# Patient Record
Sex: Male | Born: 1980 | Race: White | Hispanic: No | Marital: Married
Health system: Southern US, Community
[De-identification: ages and names within clinical notes are randomized; demographics above are authoritative.]

---

## 2004-06-05 ENCOUNTER — Emergency Department (HOSPITAL_COMMUNITY): Admission: EM | Admit: 2004-06-05 | Discharge: 2004-06-05 | Payer: Self-pay | Admitting: Emergency Medicine

## 2017-07-29 DIAGNOSIS — I10 Essential (primary) hypertension: Secondary | ICD-10-CM | POA: Diagnosis not present

## 2017-07-29 DIAGNOSIS — R51 Headache: Secondary | ICD-10-CM | POA: Diagnosis not present

## 2017-08-19 DIAGNOSIS — I10 Essential (primary) hypertension: Secondary | ICD-10-CM | POA: Diagnosis not present

## 2017-08-19 DIAGNOSIS — Z23 Encounter for immunization: Secondary | ICD-10-CM | POA: Diagnosis not present

## 2017-09-24 DIAGNOSIS — I10 Essential (primary) hypertension: Secondary | ICD-10-CM | POA: Diagnosis not present

## 2018-07-05 DIAGNOSIS — I10 Essential (primary) hypertension: Secondary | ICD-10-CM | POA: Diagnosis not present

## 2018-07-05 DIAGNOSIS — Z23 Encounter for immunization: Secondary | ICD-10-CM | POA: Diagnosis not present

## 2018-07-05 DIAGNOSIS — E782 Mixed hyperlipidemia: Secondary | ICD-10-CM | POA: Diagnosis not present

## 2018-08-18 DIAGNOSIS — R300 Dysuria: Secondary | ICD-10-CM | POA: Diagnosis not present

## 2018-09-10 DIAGNOSIS — E782 Mixed hyperlipidemia: Secondary | ICD-10-CM | POA: Diagnosis not present

## 2018-09-10 DIAGNOSIS — I10 Essential (primary) hypertension: Secondary | ICD-10-CM | POA: Diagnosis not present

## 2018-09-10 DIAGNOSIS — N183 Chronic kidney disease, stage 3 (moderate): Secondary | ICD-10-CM | POA: Diagnosis not present

## 2018-09-10 DIAGNOSIS — R739 Hyperglycemia, unspecified: Secondary | ICD-10-CM | POA: Diagnosis not present

## 2018-09-13 ENCOUNTER — Other Ambulatory Visit: Payer: Self-pay | Admitting: Family Medicine

## 2018-09-13 DIAGNOSIS — N183 Chronic kidney disease, stage 3 unspecified: Secondary | ICD-10-CM

## 2018-09-20 ENCOUNTER — Other Ambulatory Visit: Payer: Self-pay

## 2018-09-24 ENCOUNTER — Ambulatory Visit
Admission: RE | Admit: 2018-09-24 | Discharge: 2018-09-24 | Disposition: A | Discharge status: Home / Self Care | Payer: 59 | Source: Ambulatory Visit | Attending: Family Medicine | Admitting: Family Medicine

## 2018-09-24 DIAGNOSIS — N183 Chronic kidney disease, stage 3 unspecified: Secondary | ICD-10-CM

## 2018-09-24 DIAGNOSIS — N189 Chronic kidney disease, unspecified: Secondary | ICD-10-CM | POA: Diagnosis not present

## 2018-12-17 DIAGNOSIS — N182 Chronic kidney disease, stage 2 (mild): Secondary | ICD-10-CM | POA: Diagnosis not present

## 2018-12-17 DIAGNOSIS — N281 Cyst of kidney, acquired: Secondary | ICD-10-CM | POA: Diagnosis not present

## 2018-12-17 DIAGNOSIS — I129 Hypertensive chronic kidney disease with stage 1 through stage 4 chronic kidney disease, or unspecified chronic kidney disease: Secondary | ICD-10-CM | POA: Diagnosis not present

## 2018-12-28 ENCOUNTER — Other Ambulatory Visit: Payer: Self-pay | Admitting: Internal Medicine

## 2018-12-28 DIAGNOSIS — R5381 Other malaise: Secondary | ICD-10-CM

## 2018-12-28 DIAGNOSIS — I129 Hypertensive chronic kidney disease with stage 1 through stage 4 chronic kidney disease, or unspecified chronic kidney disease: Secondary | ICD-10-CM

## 2018-12-28 DIAGNOSIS — N281 Cyst of kidney, acquired: Secondary | ICD-10-CM

## 2018-12-28 DIAGNOSIS — N182 Chronic kidney disease, stage 2 (mild): Secondary | ICD-10-CM

## 2018-12-28 DIAGNOSIS — Q613 Polycystic kidney, unspecified: Secondary | ICD-10-CM

## 2018-12-31 ENCOUNTER — Other Ambulatory Visit: Payer: Self-pay | Admitting: Internal Medicine

## 2019-01-03 ENCOUNTER — Other Ambulatory Visit: Payer: Self-pay | Admitting: Internal Medicine

## 2019-01-03 DIAGNOSIS — Q613 Polycystic kidney, unspecified: Secondary | ICD-10-CM

## 2019-01-10 ENCOUNTER — Other Ambulatory Visit: Payer: Self-pay

## 2019-01-10 ENCOUNTER — Ambulatory Visit
Admission: RE | Admit: 2019-01-10 | Discharge: 2019-01-10 | Disposition: A | Discharge status: Home / Self Care | Payer: 59 | Source: Ambulatory Visit | Attending: Internal Medicine | Admitting: Internal Medicine

## 2019-01-10 DIAGNOSIS — N182 Chronic kidney disease, stage 2 (mild): Secondary | ICD-10-CM

## 2019-01-10 DIAGNOSIS — Q613 Polycystic kidney, unspecified: Secondary | ICD-10-CM | POA: Diagnosis not present

## 2019-01-10 DIAGNOSIS — I129 Hypertensive chronic kidney disease with stage 1 through stage 4 chronic kidney disease, or unspecified chronic kidney disease: Secondary | ICD-10-CM

## 2019-01-10 DIAGNOSIS — N281 Cyst of kidney, acquired: Secondary | ICD-10-CM

## 2019-01-10 MED ORDER — GADOBENATE DIMEGLUMINE 529 MG/ML IV SOLN
19.0000 mL | Freq: Once | INTRAVENOUS | Status: AC | PRN
  Administered 2019-01-10: 19 mL via INTRAVENOUS

## 2020-05-08 ENCOUNTER — Other Ambulatory Visit: Payer: Self-pay | Admitting: Internal Medicine

## 2020-05-08 DIAGNOSIS — N281 Cyst of kidney, acquired: Secondary | ICD-10-CM

## 2020-05-15 ENCOUNTER — Ambulatory Visit
Admission: RE | Admit: 2020-05-15 | Discharge: 2020-05-15 | Disposition: A | Discharge status: Home / Self Care | Payer: 59 | Source: Ambulatory Visit | Attending: Internal Medicine | Admitting: Internal Medicine

## 2020-05-15 DIAGNOSIS — N281 Cyst of kidney, acquired: Secondary | ICD-10-CM

## 2020-05-26 IMAGING — MR MRI HEAD WITHOUT AND WITH CONTRAST
12 series · 48 of 48 positions shown · IV contrast (multihance)
Comparison: MRI of the abdomen reported separately.

CLINICAL DATA: Evaluate for intracranial saccular aneurysm. History
of polycystic kidney disease.

EXAM:
MRI HEAD WITHOUT AND WITH CONTRAST
TECHNIQUE: Multiplanar, multiecho pulse sequences of the brain and surrounding
structures were obtained without and with intravenous contrast.
CONTRAST:  19mL MULTIHANCE GADOBENATE DIMEGLUMINE 529 MG/ML IV SOLN

[Series 8: T2 · axial · 4.0mm · 0.36mm/px · z∈[-58,+82]mm · 2 of 28 slices shown]
[im 1/28]
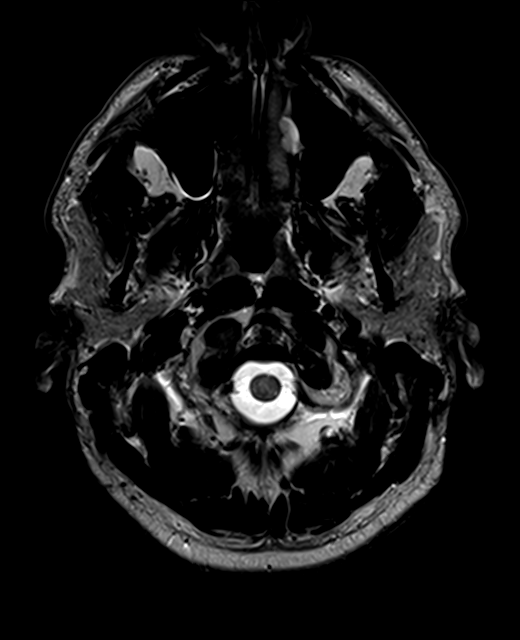
[im 28/28]
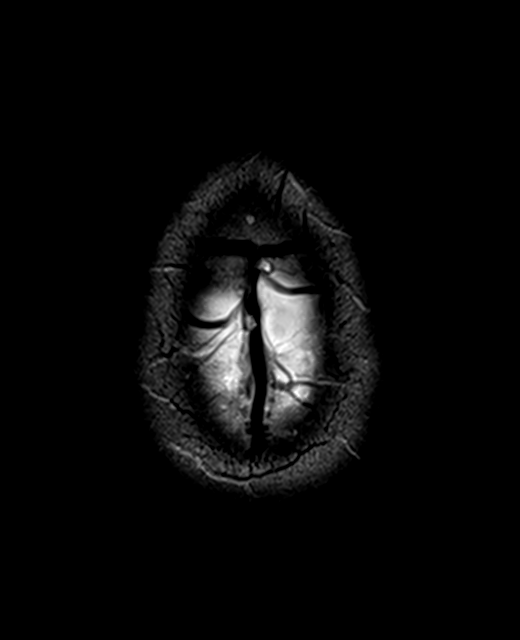

[Series 9: DWI · axial · 3.0mm · 1.44mm/px · z∈[-54,+80]mm · 6 of 84 slices shown (1 of 4)]
[im 1/84]
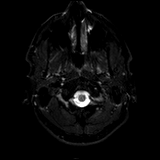
[im 17/84]
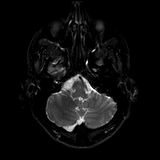
[im 34/84]
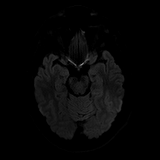
[im 50/84]
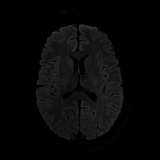
[im 67/84]
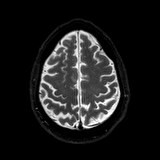
[im 84/84]
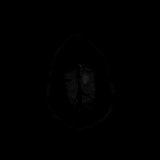

[Series 10: DWI · axial · 3.0mm · 1.44mm/px · z∈[-54,+80]mm · 3 of 40 slices shown (2 of 4)]
[im 1/40]
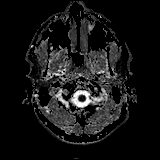
[im 20/40]
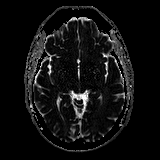
[im 40/40]
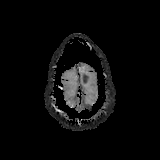

[Series 11: DWI · coronal · 5.0mm · 1.44mm/px · 4 of 64 slices shown (3 of 4)]
[im 1/64]
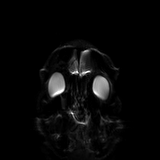
[im 22/64]
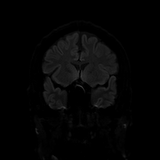
[im 43/64]
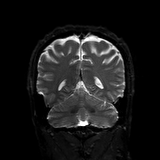
[im 64/64]
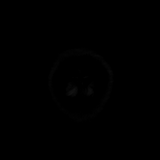

[Series 12: DWI · coronal · 5.0mm · 1.44mm/px · 2 of 32 slices shown (4 of 4)]
[im 1/32]
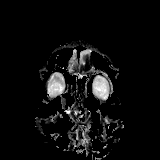
[im 32/32]
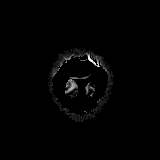

[Series 14: swi_images · axial · 4.0mm · 0.90mm/px · z∈[-58,+81]mm · 2 of 36 slices shown]
[im 1/36]
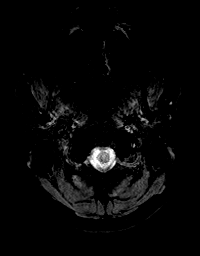
[im 36/36]
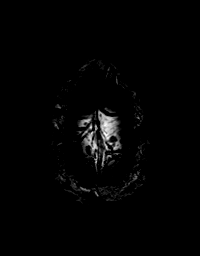

[Series 15: FLAIR · axial · 3.0mm · 0.72mm/px · z∈[-64,+91]mm · 3 of 40 slices shown]
[im 1/40]
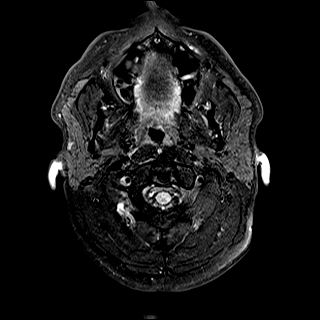
[im 20/40]
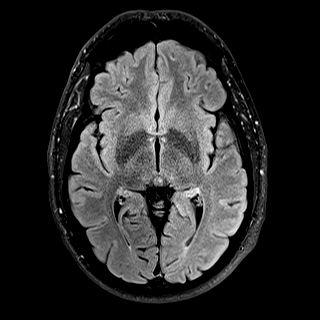
[im 40/40]
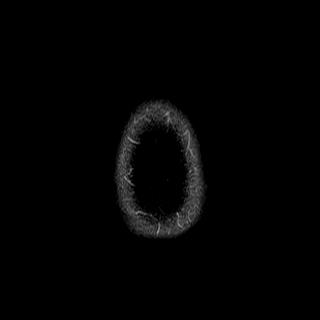

[Series 16: T1 · axial · 1.0mm · 0.90mm/px · z∈[-60,+83]mm · 10 of 144 slices shown (1 of 3)]
[im 1/144]
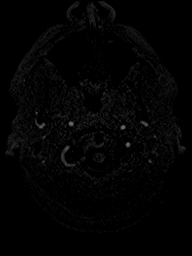
[im 16/144]
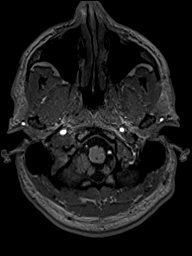
[im 32/144]
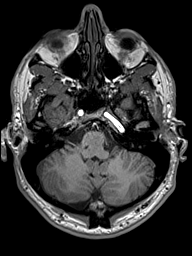
[im 48/144]
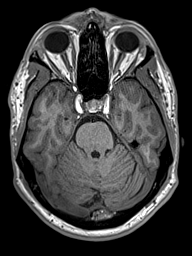
[im 64/144]
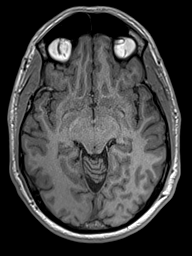
[im 80/144]
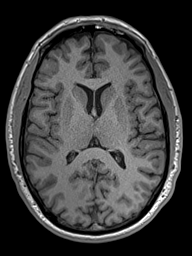
[im 96/144]
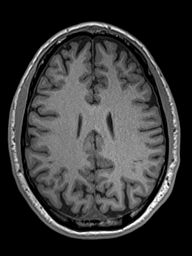
[im 112/144]
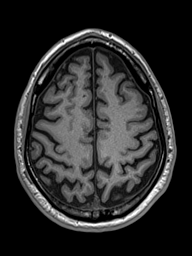
[im 128/144]
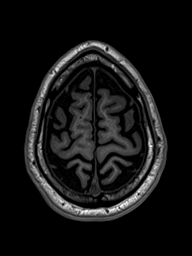
[im 144/144]
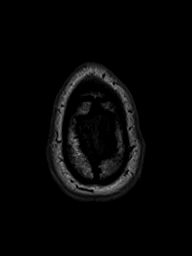

[Series 17: T1 · sagittal · 4.0mm · 0.75mm/px · 2 of 27 slices shown (2 of 3)]
[im 1/27]
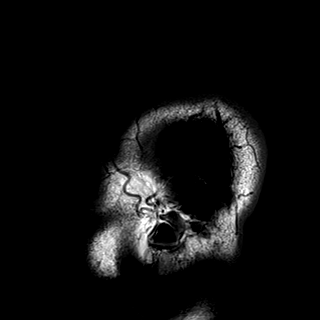
[im 27/27]
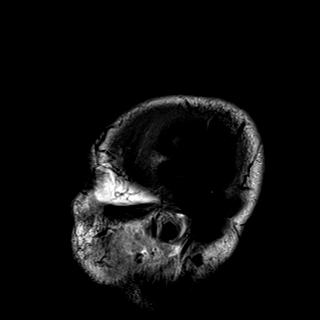

[Series 39: T2 post-contrast · coronal · 4.5mm · 0.36mm/px · 2 of 30 slices shown]
[im 1/30]
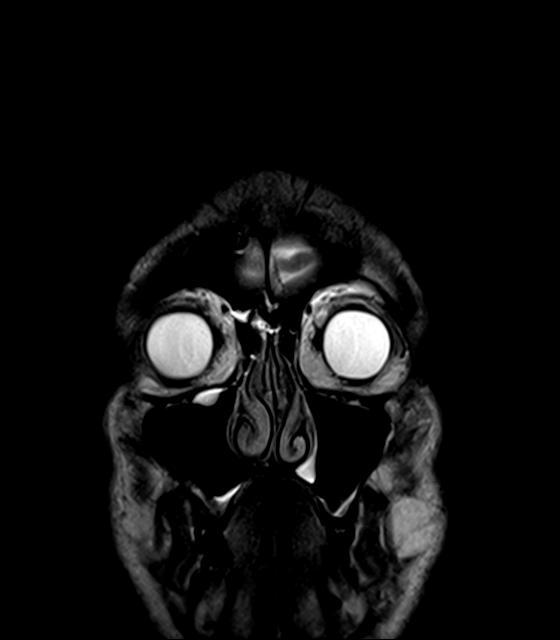
[im 30/30]
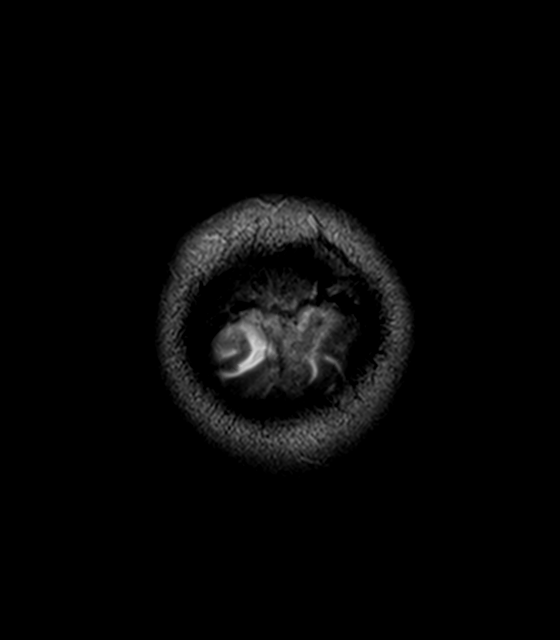

[Series 40: T1 · axial · 1.0mm · 0.90mm/px · z∈[-60,+83]mm · 10 of 144 slices shown (3 of 3)]
[im 1/144]
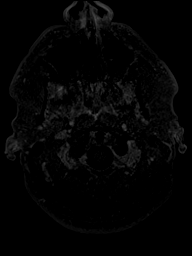
[im 16/144]
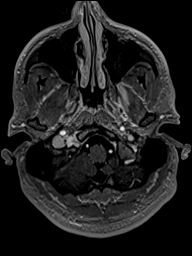
[im 32/144]
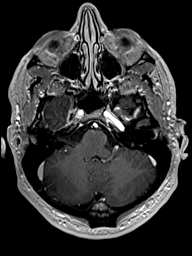
[im 48/144]
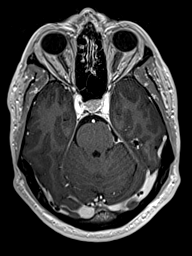
[im 64/144]
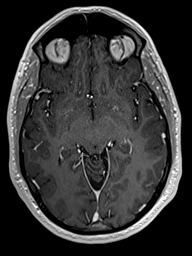
[im 80/144]
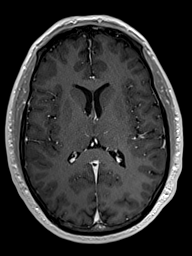
[im 96/144]
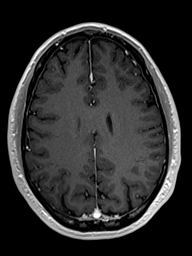
[im 112/144]
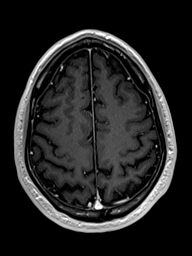
[im 128/144]
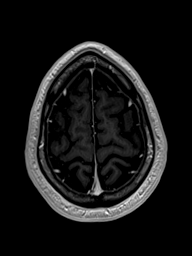
[im 144/144]
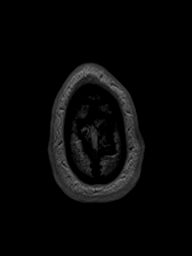

[Series 41: T1 post-contrast · coronal · 4.5mm · 0.72mm/px · 2 of 30 slices shown]
[im 1/30]
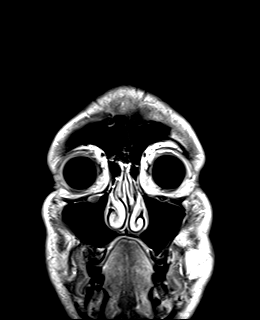
[im 30/30]
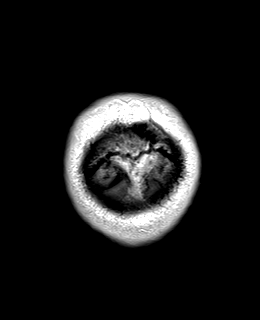

[48 of 48 positions shown; findings below may reference images not displayed]

FINDINGS: Brain: No acute infarction, hemorrhage, hydrocephalus, extra-axial
collection or mass lesion. Normal cerebral volume. No white matter
disease. Post infusion, no abnormal enhancement of the brain or
meninges.

Vascular: Normal flow voids.  No visible saccular aneurysm.

Skull and upper cervical spine: Unremarkable visualized calvarium,
skullbase, and cervical vertebrae. Pituitary, pineal, cerebellar
tonsils unremarkable. No upper cervical cord lesions.

Sinuses/Orbits: Mild paranasal sinus disease.  Negative orbits.

Other: No nasopharyngeal pathology or mastoid fluid. Scalp and other
visualized extracranial soft tissues grossly unremarkable.
IMPRESSION: MRI of the brain is negative. No acute or focal intracranial
abnormality. Visualized vessels demonstrate no saccular aneurysm or
other vascular malformation.

Optimal screening for intracranial saccular aneurysm in this
population would include MRA of the intracranial circulation. This
can be performed without contrast. CJASN April 2018, 14 (8)

## 2020-11-30 ENCOUNTER — Other Ambulatory Visit: Payer: Self-pay | Admitting: Internal Medicine

## 2020-11-30 DIAGNOSIS — N281 Cyst of kidney, acquired: Secondary | ICD-10-CM

## 2020-12-18 ENCOUNTER — Ambulatory Visit
Admission: RE | Admit: 2020-12-18 | Discharge: 2020-12-18 | Disposition: A | Discharge status: Home / Self Care | Payer: BC Managed Care – PPO | Source: Ambulatory Visit | Attending: Internal Medicine | Admitting: Internal Medicine

## 2020-12-18 DIAGNOSIS — N281 Cyst of kidney, acquired: Secondary | ICD-10-CM | POA: Diagnosis not present

## 2021-02-20 DIAGNOSIS — N183 Chronic kidney disease, stage 3 unspecified: Secondary | ICD-10-CM | POA: Diagnosis not present

## 2021-02-20 DIAGNOSIS — I129 Hypertensive chronic kidney disease with stage 1 through stage 4 chronic kidney disease, or unspecified chronic kidney disease: Secondary | ICD-10-CM | POA: Diagnosis not present

## 2021-02-20 DIAGNOSIS — E6609 Other obesity due to excess calories: Secondary | ICD-10-CM | POA: Diagnosis not present

## 2021-02-20 DIAGNOSIS — Q613 Polycystic kidney, unspecified: Secondary | ICD-10-CM | POA: Diagnosis not present

## 2021-08-27 DIAGNOSIS — Q613 Polycystic kidney, unspecified: Secondary | ICD-10-CM | POA: Diagnosis not present

## 2021-08-27 DIAGNOSIS — N182 Chronic kidney disease, stage 2 (mild): Secondary | ICD-10-CM | POA: Diagnosis not present

## 2021-08-27 DIAGNOSIS — N281 Cyst of kidney, acquired: Secondary | ICD-10-CM | POA: Diagnosis not present

## 2021-08-27 DIAGNOSIS — I129 Hypertensive chronic kidney disease with stage 1 through stage 4 chronic kidney disease, or unspecified chronic kidney disease: Secondary | ICD-10-CM | POA: Diagnosis not present

## 2021-12-04 DIAGNOSIS — Z3009 Encounter for other general counseling and advice on contraception: Secondary | ICD-10-CM | POA: Diagnosis not present

## 2021-12-06 ENCOUNTER — Other Ambulatory Visit: Payer: Self-pay | Admitting: Internal Medicine

## 2021-12-06 DIAGNOSIS — N281 Cyst of kidney, acquired: Secondary | ICD-10-CM

## 2021-12-13 ENCOUNTER — Other Ambulatory Visit: Payer: Self-pay

## 2021-12-13 ENCOUNTER — Ambulatory Visit
Admission: RE | Admit: 2021-12-13 | Discharge: 2021-12-13 | Disposition: A | Discharge status: Home / Self Care | Payer: BC Managed Care – PPO | Source: Ambulatory Visit | Attending: Internal Medicine | Admitting: Internal Medicine

## 2021-12-13 DIAGNOSIS — Q613 Polycystic kidney, unspecified: Secondary | ICD-10-CM | POA: Diagnosis not present

## 2021-12-13 DIAGNOSIS — Z87448 Personal history of other diseases of urinary system: Secondary | ICD-10-CM | POA: Diagnosis not present

## 2021-12-13 DIAGNOSIS — N281 Cyst of kidney, acquired: Secondary | ICD-10-CM

## 2021-12-26 ENCOUNTER — Other Ambulatory Visit: Payer: Self-pay | Admitting: Internal Medicine

## 2021-12-26 DIAGNOSIS — N182 Chronic kidney disease, stage 2 (mild): Secondary | ICD-10-CM | POA: Diagnosis not present

## 2021-12-26 DIAGNOSIS — Q613 Polycystic kidney, unspecified: Secondary | ICD-10-CM | POA: Diagnosis not present

## 2021-12-26 DIAGNOSIS — I129 Hypertensive chronic kidney disease with stage 1 through stage 4 chronic kidney disease, or unspecified chronic kidney disease: Secondary | ICD-10-CM | POA: Diagnosis not present

## 2021-12-26 DIAGNOSIS — N281 Cyst of kidney, acquired: Secondary | ICD-10-CM

## 2022-01-01 NOTE — Progress Notes (Signed)
?Terrilee Files D.O. ?[facility] ?[address] [place] [address] ?Phone: [phone] ?Subjective:   ?I, Mitchell Moses, am serving as a scribe for Dr. Antoine Primas. ? ?This visit occurred during the SARS-CoV-2 public health emergency.  Safety protocols were in place, including screening questions prior to the visit, additional usage of staff PPE, and extensive cleaning of exam room while observing appropriate contact time as indicated for disinfecting solutions.  ? ? ?I'm seeing this patient by the request  of:  Sigmund Hazel, MD ? ?CC: Back discomfort, foot pain, polycystic kidney disease ? ?NOM:[id number]  ?Mitchell Moses is a 41 y.o. male coming in with complaint of kidney disease. Patient states that he is also having R plantar facial pain. Likes to do Crossfit. Patient likes to run.  Patient is mostly concerned more about the polycystic kidney disease.  Wants to make sure that he is able to increase the to bleeding.  Patient has had difficulty with this previously ? ? ? ?  ? ? ? ?Social History  ? ?Socioeconomic History  ? Marital status: Married  ?  Spouse name: Not on file  ? Number of children: Not on file  ? Years of education: Not on file  ? Highest education level: Not on file  ?Occupational History  ? Not on file  ?Tobacco Use  ? Smoking status: Not on file  ? Smokeless tobacco: Not on file  ?Substance and Sexual Activity  ? Alcohol use: Not on file  ? Drug use: Not on file  ? Sexual activity: Not on file  ?Other Topics Concern  ? Not on file  ?Social History Narrative  ? Not on file  ? ?Social Determinants of Health  ? ?Financial Resource Strain: Not on file  ?Food Insecurity: Not on file  ?Transportation Needs: Not on file  ?Physical Activity: Not on file  ?Stress: Not on file  ?Social Connections: Not on file  ? ?Not on File ?No family history on file. ? ? ?Current Outpatient Medications (Cardiovascular):  ?  amlodipine-atorvastatin (CADUET) 10-10 MG tablet, Take 1 tablet by mouth  daily. ?  chlorthalidone (HYGROTON) 25 MG tablet, Take 25 mg by mouth daily. ?  losartan (COZAAR) 100 MG tablet, Take 100 mg by mouth daily. ? ? ? ? ? ? ?Reviewed prior external information including notes and imaging from  ?primary care provider ?As well as notes that were available from care everywhere and other healthcare systems.  Reviewed outside records.  Patient has had some elevated liver enzymes improving significantly at this moment.  Patient has had elevated creatinine but GFR has always been unremarkable. ? ?Past medical history, social, surgical and family history all reviewed in electronic medical record.  No pertanent information unless stated regarding to the chief complaint.  ? ?Review of Systems: ? No headache, visual changes, nausea, vomiting, diarrhea, constipation, dizziness, abdominal pain, skin rash, fevers, chills, night sweats, weight loss, swollen lymph nodes, body aches, joint swelling, chest pain, shortness of breath, mood changes. POSITIVE muscle aches ? ?Objective  ?Blood pressure 132/90, pulse 64, height 6' (1.829 m), weight 236 lb (107 kg), SpO2 99 %. ?  ?General: No apparent distress alert and oriented x3 mood and affect normal, dressed appropriately.  ?HEENT: Pupils equal, extraocular movements intact  ?Respiratory: Patient's speak in full sentences and does not appear short of breath  ?Cardiovascular: No lower extremity edema, non tender, no erythema  ?Gait normal ?MSK: Patient is sitting comfortably. ? ?  ?Impression and Recommendations:  ?  ? ?  The above documentation has been reviewed and is accurate and complete Lyndal Pulley, DO ? ? ? ?

## 2022-01-06 ENCOUNTER — Ambulatory Visit (INDEPENDENT_AMBULATORY_CARE_PROVIDER_SITE_OTHER): Payer: BC Managed Care – PPO | Admitting: Family Medicine

## 2022-01-06 DIAGNOSIS — M2550 Pain in unspecified joint: Secondary | ICD-10-CM

## 2022-01-06 DIAGNOSIS — R748 Abnormal levels of other serum enzymes: Secondary | ICD-10-CM | POA: Diagnosis not present

## 2022-01-06 DIAGNOSIS — N183 Chronic kidney disease, stage 3 unspecified: Secondary | ICD-10-CM | POA: Insufficient documentation

## 2022-01-06 DIAGNOSIS — Q619 Cystic kidney disease, unspecified: Secondary | ICD-10-CM

## 2022-01-06 DIAGNOSIS — R5383 Other fatigue: Secondary | ICD-10-CM | POA: Diagnosis not present

## 2022-01-06 DIAGNOSIS — E782 Mixed hyperlipidemia: Secondary | ICD-10-CM | POA: Insufficient documentation

## 2022-01-06 DIAGNOSIS — I129 Hypertensive chronic kidney disease with stage 1 through stage 4 chronic kidney disease, or unspecified chronic kidney disease: Secondary | ICD-10-CM | POA: Insufficient documentation

## 2022-01-06 LAB — SEDIMENTATION RATE: Sed Rate: 6 mm/hr (ref 0–15)

## 2022-01-06 LAB — HEPATIC FUNCTION PANEL
ALT: 62 U/L — ABNORMAL HIGH (ref 0–53)
AST: 53 U/L — ABNORMAL HIGH (ref 0–37)
Albumin: 4.7 g/dL (ref 3.5–5.2)
Alkaline Phosphatase: 58 U/L (ref 39–117)
Bilirubin, Direct: 0.2 mg/dL (ref 0.0–0.3)
Total Bilirubin: 1.2 mg/dL (ref 0.2–1.2)
Total Protein: 7 g/dL (ref 6.0–8.3)

## 2022-01-06 LAB — T3, FREE: T3, Free: 3.1 pg/mL (ref 2.3–4.2)

## 2022-01-06 LAB — PSA: PSA: 0.73 ng/mL (ref 0.10–4.00)

## 2022-01-06 LAB — COMPREHENSIVE METABOLIC PANEL
ALT: 62 U/L — ABNORMAL HIGH (ref 0–53)
AST: 53 U/L — ABNORMAL HIGH (ref 0–37)
Albumin: 4.7 g/dL (ref 3.5–5.2)
Alkaline Phosphatase: 58 U/L (ref 39–117)
BUN: 22 mg/dL (ref 6–23)
CO2: 30 mEq/L (ref 19–32)
Calcium: 9.7 mg/dL (ref 8.4–10.5)
Chloride: 101 mEq/L (ref 96–112)
Creatinine, Ser: 1.33 mg/dL (ref 0.40–1.50)
GFR: 67.2 mL/min (ref >60.00)
Glucose, Bld: 101 mg/dL — ABNORMAL HIGH (ref 70–99)
Potassium: 3.7 mEq/L (ref 3.5–5.1)
Sodium: 139 mEq/L (ref 135–145)
Total Bilirubin: 1.2 mg/dL (ref 0.2–1.2)
Total Protein: 7 g/dL (ref 6.0–8.3)

## 2022-01-06 LAB — IBC PANEL
Iron: 120 ug/dL (ref 42–165)
Saturation Ratios: 35.1 % (ref 20.0–50.0)
TIBC: 341.6 ug/dL (ref 250.0–450.0)
Transferrin: 244 mg/dL (ref 212.0–360.0)

## 2022-01-06 LAB — URIC ACID: Uric Acid, Serum: 7 mg/dL (ref 4.0–7.8)

## 2022-01-06 LAB — CORTISOL: Cortisol, Plasma: 7.4 ug/dL

## 2022-01-06 LAB — T4, FREE: Free T4: 1.08 ng/dL (ref 0.60–1.60)

## 2022-01-06 LAB — CK: Total CK: 2656 U/L — ABNORMAL HIGH (ref 7–232)

## 2022-01-06 LAB — TSH: TSH: 2.08 u[IU]/mL (ref 0.35–5.50)

## 2022-01-06 LAB — FERRITIN: Ferritin: 126.8 ng/mL (ref 22.0–322.0)

## 2022-01-06 NOTE — Patient Instructions (Addendum)
Good to see you ?Labs before you go  ?Hoka or Oofos recovery sandals in the house ?Continue the exercises for plantar fasciitis  ?Follow up in 6 weeks  ? ?

## 2022-01-06 NOTE — Assessment & Plan Note (Addendum)
Patient is more concerned with the polycystic kidney disease.  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  Nobody in his family about dosing of sure.  At this point the patient did have some elevated liver enzymes as well and will need to further evaluate this. ?Patient will continue to monitor everything else at the moment.  Patient is not on any significant medications that likely would be contributing to this.  Patient has not taken any significant vitamin supplementation that would also be potentially causing the abnormalities in the labs.  Encourage patient to follow-up with his nephrologist as well as primary care provider. ?

## 2022-01-06 NOTE — Assessment & Plan Note (Signed)
With reviewing patient's chart and noted CVs elevated.  We will get laboratory work-up to make sure nothing else is contributing. ?

## 2022-01-07 ENCOUNTER — Other Ambulatory Visit: Payer: Self-pay | Admitting: Internal Medicine

## 2022-01-08 ENCOUNTER — Other Ambulatory Visit: Payer: Self-pay

## 2022-01-08 LAB — TESTOSTERONE, FREE, TOTAL, SHBG
Sex Hormone Binding: 49.2 nmol/L (ref 16.5–55.9)
Testosterone, Free: 10.8 pg/mL (ref 8.7–25.1)
Testosterone: 525 ng/dL (ref 264–916)

## 2022-01-09 LAB — TEST AUTHORIZATION

## 2022-01-09 LAB — HEP PANEL, GENERAL
Hep B Core Total Ab: NONREACTIVE
Hep B S Ab: NONREACTIVE
Hepatitis A AB,Total: NONREACTIVE
Hepatitis B Surface Ag: NONREACTIVE
Hepatitis C Ab: NONREACTIVE
SIGNAL TO CUT-OFF: 0.12 (ref <1.00)

## 2022-01-09 LAB — PTH, INTACT AND CALCIUM
Calcium: 9.7 mg/dL (ref 8.6–10.3)
PTH: 30 pg/mL (ref 16–77)

## 2022-01-09 LAB — HIV ANTIBODY (ROUTINE TESTING W REFLEX): HIV 1&2 Ab, 4th Generation: NONREACTIVE

## 2022-01-09 LAB — VITAMIN D 1,25 DIHYDROXY
Vitamin D 1, 25 (OH)2 Total: 45 pg/mL (ref 18–72)
Vitamin D2 1, 25 (OH)2: <8 pg/mL
Vitamin D3 1, 25 (OH)2: 45 pg/mL

## 2022-01-09 LAB — ANA: Anti Nuclear Antibody (ANA): NEGATIVE

## 2022-01-21 ENCOUNTER — Encounter: Payer: Self-pay | Admitting: Family Medicine

## 2022-01-28 ENCOUNTER — Other Ambulatory Visit: Payer: Self-pay

## 2022-01-28 ENCOUNTER — Other Ambulatory Visit (INDEPENDENT_AMBULATORY_CARE_PROVIDER_SITE_OTHER): Payer: BC Managed Care – PPO

## 2022-01-28 ENCOUNTER — Encounter: Payer: Self-pay | Admitting: Family Medicine

## 2022-01-28 DIAGNOSIS — M2550 Pain in unspecified joint: Secondary | ICD-10-CM

## 2022-01-28 LAB — CBC WITH DIFFERENTIAL/PLATELET
Basophils Absolute: 0 10*3/uL (ref 0.0–0.1)
Basophils Relative: 0.4 % (ref 0.0–3.0)
Eosinophils Absolute: 0.1 10*3/uL (ref 0.0–0.7)
Eosinophils Relative: 2.5 % (ref 0.0–5.0)
HCT: 39.3 % (ref 39.0–52.0)
Hemoglobin: 13.9 g/dL (ref 13.0–17.0)
Lymphocytes Relative: 28.7 % (ref 12.0–46.0)
Lymphs Abs: 1.7 10*3/uL (ref 0.7–4.0)
MCHC: 35.4 g/dL (ref 30.0–36.0)
MCV: 90 fl (ref 78.0–100.0)
Monocytes Absolute: 0.4 10*3/uL (ref 0.1–1.0)
Monocytes Relative: 7.5 % (ref 3.0–12.0)
Neutro Abs: 3.5 10*3/uL (ref 1.4–7.7)
Neutrophils Relative %: 60.9 % (ref 43.0–77.0)
Platelets: 157 10*3/uL (ref 150.0–400.0)
RBC: 4.37 Mil/uL (ref 4.22–5.81)
RDW: 13.4 % (ref 11.5–15.5)
WBC: 5.8 10*3/uL (ref 4.0–10.5)

## 2022-01-28 LAB — COMPREHENSIVE METABOLIC PANEL
ALT: 18 U/L (ref 0–53)
AST: 17 U/L (ref 0–37)
Albumin: 4.5 g/dL (ref 3.5–5.2)
Alkaline Phosphatase: 63 U/L (ref 39–117)
BUN: 18 mg/dL (ref 6–23)
CO2: 30 mEq/L (ref 19–32)
Calcium: 9.4 mg/dL (ref 8.4–10.5)
Chloride: 103 mEq/L (ref 96–112)
Creatinine, Ser: 1.35 mg/dL (ref 0.40–1.50)
GFR: 65.98 mL/min (ref >60.00)
Glucose, Bld: 98 mg/dL (ref 70–99)
Potassium: 3.9 mEq/L (ref 3.5–5.1)
Sodium: 139 mEq/L (ref 135–145)
Total Bilirubin: 1.2 mg/dL (ref 0.2–1.2)
Total Protein: 6.7 g/dL (ref 6.0–8.3)

## 2022-01-28 LAB — FERRITIN: Ferritin: 116.8 ng/mL (ref 22.0–322.0)

## 2022-01-28 LAB — SEDIMENTATION RATE: Sed Rate: 3 mm/hr (ref 0–15)

## 2022-01-28 LAB — VITAMIN B12: Vitamin B-12: 338 pg/mL (ref 211–911)

## 2022-01-28 LAB — VITAMIN D 25 HYDROXY (VIT D DEFICIENCY, FRACTURES): VITD: 37.06 ng/mL (ref 30.00–100.00)

## 2022-01-28 LAB — TESTOSTERONE: Testosterone: 423.85 ng/dL (ref 300.00–890.00)

## 2022-01-31 ENCOUNTER — Ambulatory Visit
Admission: RE | Admit: 2022-01-31 | Discharge: 2022-01-31 | Disposition: A | Discharge status: Home / Self Care | Payer: BC Managed Care – PPO | Source: Ambulatory Visit | Attending: Internal Medicine | Admitting: Internal Medicine

## 2022-01-31 DIAGNOSIS — N281 Cyst of kidney, acquired: Secondary | ICD-10-CM | POA: Diagnosis not present

## 2022-01-31 MED ORDER — GADOBENATE DIMEGLUMINE 529 MG/ML IV SOLN
20.0000 mL | Freq: Once | INTRAVENOUS | Status: AC | PRN
  Administered 2022-01-31: 20 mL via INTRAVENOUS

## 2022-02-06 DIAGNOSIS — N281 Cyst of kidney, acquired: Secondary | ICD-10-CM | POA: Diagnosis not present

## 2022-02-06 DIAGNOSIS — I129 Hypertensive chronic kidney disease with stage 1 through stage 4 chronic kidney disease, or unspecified chronic kidney disease: Secondary | ICD-10-CM | POA: Diagnosis not present

## 2022-02-06 DIAGNOSIS — N182 Chronic kidney disease, stage 2 (mild): Secondary | ICD-10-CM | POA: Diagnosis not present

## 2022-02-06 DIAGNOSIS — Q613 Polycystic kidney, unspecified: Secondary | ICD-10-CM | POA: Diagnosis not present

## 2022-02-24 DIAGNOSIS — Z23 Encounter for immunization: Secondary | ICD-10-CM | POA: Diagnosis not present

## 2022-02-24 DIAGNOSIS — N183 Chronic kidney disease, stage 3 unspecified: Secondary | ICD-10-CM | POA: Diagnosis not present

## 2022-02-24 DIAGNOSIS — Q613 Polycystic kidney, unspecified: Secondary | ICD-10-CM | POA: Diagnosis not present

## 2022-02-24 DIAGNOSIS — I129 Hypertensive chronic kidney disease with stage 1 through stage 4 chronic kidney disease, or unspecified chronic kidney disease: Secondary | ICD-10-CM | POA: Diagnosis not present

## 2022-04-09 NOTE — Progress Notes (Signed)
[facility] [address] [place] [address] Phone: [phone] Subjective:   INadine Counts, am serving as a scribe for Dr. Antoine Primas.  I'm seeing this patient by the request  of:  Sigmund Hazel, MD  CC: Right knee pain  [id number]  01/06/2022 Patient is more concerned with the polycystic kidney disease.  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  Nobody in his family about dosing of sure.  At this point the patient did have some elevated liver enzymes as well and will need to further evaluate this. Patient will continue to monitor everything else at the moment.  Patient is not on any significant medications that likely would be contributing to this.  Patient has not taken any significant vitamin supplementation that would also be potentially causing the abnormalities in the labs.  Encourage patient to follow-up with his nephrologist as well as primary care provider.  Update 04/11/2022 Yang Rack is a 41 y.o. male coming in with complaint of right knee pain.  Patient states pain is located on the medial side. Pain has been there since Sunday when he ran. Couple od days later did crossfit and ran again.      Social History   Socioeconomic History   Marital status: Married    Spouse name: Not on file   Number of children: Not on file   Years of education: Not on file   Highest education level: Not on file  Occupational History   Not on file  Tobacco Use   Smoking status: Not on file   Smokeless tobacco: Not on file  Substance and Sexual Activity   Alcohol use: Not on file   Drug use: Not on file   Sexual activity: Not on file  Other Topics Concern   Not on file  Social History Narrative   Not on file   Social Determinants of Health   Financial Resource Strain: Not on file  Food Insecurity: Not on file  Transportation Needs: Not on file  Physical Activity: Not on file  Stress: Not on file  Social Connections: Not on file   Not on File No  family history on file.   Current Outpatient Medications (Cardiovascular):    amlodipine-atorvastatin (CADUET) 10-10 MG tablet, Take 1 tablet by mouth daily.   chlorthalidone (HYGROTON) 25 MG tablet, Take 25 mg by mouth daily.   losartan (COZAAR) 100 MG tablet, Take 100 mg by mouth daily.     Reviewed prior external information including notes and imaging from  primary care provider As well as notes that were available from care everywhere and other healthcare systems.  Past medical history, social, surgical and family history all reviewed in electronic medical record.  No pertanent information unless stated regarding to the chief complaint.   Review of Systems:  No headache, visual changes, nausea, vomiting, diarrhea, constipation, dizziness, abdominal pain, skin rash, fevers, chills, night sweats, weight loss, swollen lymph nodes, body aches, joint swelling, chest pain, shortness of breath, mood changes. POSITIVE muscle aches  Objective  Blood pressure 128/80, pulse (!) 58, height 6' (1.829 m), SpO2 100 %.   General: No apparent distress alert and oriented x3 mood and affect normal, dressed appropriately.  HEENT: Pupils equal, extraocular movements intact  Respiratory: Patient's speak in full sentences and does not appear short of breath  Cardiovascular: No lower extremity edema, non tender, no erythema  Right knee does have tenderness over the medial joint line.  Lacks last 10 degrees of flexion in the  last 2 degrees of extension.  Positive McMurray's noted.  Limited muscular skeletal ultrasound was performed and interpreted by Antoine Primas, M  Limited ultrasound of patient's left knee shows the patient does have what appears to be displacement of the posterior medial meniscus noted.  Patient does have a moderate effusion noted of the patellofemoral joint as well.  No significant arthritic changes noted. Impression: Acute medial meniscal tear   After informed written and verbal  consent, patient was seated on exam table. Right knee was prepped with alcohol swab and utilizing anterolateral approach, patient's right knee space was injected with 4:1  marcaine 0.5%: Kenalog 40mg /dL. Patient tolerated the procedure well without immediate complications.    Impression and Recommendations:    The above documentation has been reviewed and is accurate and complete , DO

## 2022-04-11 ENCOUNTER — Ambulatory Visit (INDEPENDENT_AMBULATORY_CARE_PROVIDER_SITE_OTHER): Payer: BC Managed Care – PPO

## 2022-04-11 ENCOUNTER — Ambulatory Visit: Payer: Self-pay

## 2022-04-11 ENCOUNTER — Ambulatory Visit (INDEPENDENT_AMBULATORY_CARE_PROVIDER_SITE_OTHER): Payer: BC Managed Care – PPO | Admitting: Family Medicine

## 2022-04-11 VITALS — BP 128/80 | HR 58 | Ht 72.0 in

## 2022-04-11 DIAGNOSIS — M25561 Pain in right knee: Secondary | ICD-10-CM

## 2022-04-11 DIAGNOSIS — S83241A Other tear of medial meniscus, current injury, right knee, initial encounter: Secondary | ICD-10-CM

## 2022-04-11 NOTE — Patient Instructions (Signed)
Good to see you  Ice 20 minutes 2 times daily. Usually after activity and before bed. Exercises 3 times a week.  No twisting motions  Xray on way out  See me again in 5 weeks

## 2022-04-11 NOTE — Assessment & Plan Note (Signed)
Injection given today  Discussed home exercises.  Discussed avoiding any twisting motions.  Patient did have an effusion of the knee that we will need to continue to monitor.  Depending on how patient responds

## 2022-04-14 ENCOUNTER — Encounter: Payer: Self-pay | Admitting: Family Medicine

## 2022-05-14 NOTE — Progress Notes (Unsigned)
[facility] [address] [place] [address] Phone: [phone] Subjective:   Bruce Donath, am serving as a scribe for Dr. Antoine Primas.  I'm seeing this patient by the request  of:  Sigmund Hazel, MD  CC: Right knee pain follow-up  [id number]  04/11/2022 Injection given today  Discussed home exercises.  Discussed avoiding any twisting motions.  Patient did have an effusion of the knee that we will need to continue to monitor.  Depending on how patient responds  Update 05/15/2022 Timothey Dahlstrom is a 41 y.o. male coming in with complaint of R knee pain. Patient states that his pain improved with injection. Always has some soreness to joint.   Xray R knee 04/11/2022 IMPRESSION: Apparent mild lipohemarthrosis. This raises the question of a radiographically occult fracture. If there is clinical concern for an acute nondisplaced fracture, consider CT or MRI for further evaluation.   Mild medial compartment joint space narrowing.       Social History   Socioeconomic History   Marital status: Married    Spouse name: Not on file   Number of children: Not on file   Years of education: Not on file   Highest education level: Not on file  Occupational History   Not on file  Tobacco Use   Smoking status: Not on file   Smokeless tobacco: Not on file  Substance and Sexual Activity   Alcohol use: Not on file   Drug use: Not on file   Sexual activity: Not on file  Other Topics Concern   Not on file  Social History Narrative   Not on file   Social Determinants of Health   Financial Resource Strain: Not on file  Food Insecurity: Not on file  Transportation Needs: Not on file  Physical Activity: Not on file  Stress: Not on file  Social Connections: Not on file   Not on File No family history on file.   Current Outpatient Medications (Cardiovascular):    chlorthalidone (HYGROTON) 25 MG tablet, Take 25 mg by mouth daily.    losartan (COZAAR) 100 MG tablet, Take 100 mg by mouth daily.   amlodipine-atorvastatin (CADUET) 10-10 MG tablet, Take 1 tablet by mouth daily.     Current Outpatient Medications (Other):    AMBULATORY NON FORMULARY MEDICATION, 1 Units by Other route once for 1 dose.    Reviewed prior external information including notes and imaging from  primary care provider As well as notes that were available from care everywhere and other healthcare systems.  Past medical history, social, surgical and family history all reviewed in electronic medical record.  No pertanent information unless stated regarding to the chief complaint.   Review of Systems:  No headache, visual changes, nausea, vomiting, diarrhea, constipation, dizziness, abdominal pain, skin rash, fevers, chills, night sweats, weight loss, swollen lymph nodes, body aches, joint swelling, chest pain, shortness of breath, mood changes. POSITIVE muscle aches  Objective  Blood pressure 132/80, pulse (!) 59, height 6' (1.829 m), weight 238 lb (108 kg), SpO2 99 %.   General: No apparent distress alert and oriented x3 mood and affect normal, dressed appropriately.  HEENT: Pupils equal, extraocular movements intact  Respiratory: Patient's speak in full sentences and does not appear short of breath  Cardiovascular: No lower extremity edema, non tender, no erythema  Right knee exam has no significant swelling noted at this time.  Still some mild positive McMurray's and still tenderness to palpation of the posterior  medial aspect of the knee.  No masses appreciated.  Full range of motion otherwise noted but does have pain with full extension.  Limited muscular skeletal ultrasound was performed and interpreted by Antoine Primas, M  Limited ultrasound shows complete resolution of the effusion noted previously.  Patient still has a medial meniscus tear noted with displacement of approximately 25% of the posterior aspect.  Mild surrounding hyperechoic  changes in the area. Impression: Meniscal tear still noted with interval improvement of the effusion    Impression and Recommendations:     The above documentation has been reviewed and is accurate and complete Judi Saa, DO

## 2022-05-15 ENCOUNTER — Ambulatory Visit (INDEPENDENT_AMBULATORY_CARE_PROVIDER_SITE_OTHER): Payer: BC Managed Care – PPO | Admitting: Family Medicine

## 2022-05-15 ENCOUNTER — Ambulatory Visit: Payer: Self-pay

## 2022-05-15 ENCOUNTER — Encounter: Payer: Self-pay | Admitting: Family Medicine

## 2022-05-15 VITALS — BP 132/80 | HR 59 | Ht 72.0 in | Wt 238.0 lb

## 2022-05-15 DIAGNOSIS — S83241D Other tear of medial meniscus, current injury, right knee, subsequent encounter: Secondary | ICD-10-CM | POA: Diagnosis not present

## 2022-05-15 DIAGNOSIS — M25561 Pain in right knee: Secondary | ICD-10-CM

## 2022-05-15 MED ORDER — AMBULATORY NON FORMULARY MEDICATION: 1.0000 [IU] | Freq: Once | 0 refills | Status: AC

## 2022-05-15 NOTE — Assessment & Plan Note (Signed)
Patient has responded extremely well to the injection at the moment.  We discussed with patient about icing regimen and home exercises otherwise.  Is improving so I do not think that further evaluation with imaging would be necessary at the moment and patient does agree.  Was given the choice to repeat the x-rays.  Follow-up with me again in 4 to 6 weeks otherwise.

## 2022-05-15 NOTE — Patient Instructions (Signed)
Polar ice knee brace Avoid twisting for another month See me again in 4-6 weeks

## 2022-05-16 ENCOUNTER — Ambulatory Visit: Payer: BC Managed Care – PPO | Admitting: Family Medicine

## 2022-06-19 NOTE — Progress Notes (Signed)
  Mitchell Moses [address] [place] Phone: [phone] Subjective:   Mitchell Moses, am serving as a scribe for Dr. Hulan Saas.  I'm seeing this patient by the request  of:  Kathyrn Lass, MD  CC: right knee pain   [id number]  05/15/2022 Patient has responded extremely well to the injection at the moment.  We discussed with patient about icing regimen and home exercises otherwise.  Is improving so I do not think that further evaluation with imaging would be necessary at the moment and patient does agree.  Was given the choice to repeat the x-rays.  Follow-up with me again in 4 to 6 weeks otherwise.  Update 06/24/2022 Mitchell Moses is a 41 y.o. male coming in with complaint of R knee pain. Patient states doing well. Wants to be cleared to run.         Review of Systems:  No headache, visual changes, nausea, vomiting, diarrhea, constipation, dizziness, abdominal pain, skin rash, fevers, chills, night sweats, weight loss, swollen lymph nodes, body aches, joint swelling, chest pain, shortness of breath, mood changes. POSITIVE muscle aches  Objective  Blood pressure 120/76, pulse 71, height 6' (1.829 m), weight 222 lb (100.7 kg), SpO2 99 %.   General: No apparent distress alert and oriented x3 mood and affect normal, dressed appropriately.  HEENT: Pupils equal, extraocular movements intact  Respiratory: Patient's speak in full sentences and does not appear short of breath  Cardiovascular: No lower extremity edema, non tender, no erythema  Right knee pain- ttp medial aspect of the knee good stability      Impression and Recommendations:    The above documentation has been reviewed and is accurate and complete Lyndal Pulley, DO

## 2022-06-24 ENCOUNTER — Ambulatory Visit (INDEPENDENT_AMBULATORY_CARE_PROVIDER_SITE_OTHER): Payer: BC Managed Care – PPO | Admitting: Family Medicine

## 2022-06-24 DIAGNOSIS — S83241D Other tear of medial meniscus, current injury, right knee, subsequent encounter: Secondary | ICD-10-CM | POA: Diagnosis not present

## 2022-06-24 NOTE — Assessment & Plan Note (Signed)
Patient seems to be doing very well.  Continues to have some mild discomfort noted on the medial aspect of the knee.  Patient now feels like he has made some good progress.  Patient states not having any pain and does want to run again.  We will encourage him to try and see how he responds.  Worsening pain will need to consider the possibility of MRI if any worsening pain again but hopefully patient does fine.

## 2022-06-30 NOTE — Progress Notes (Signed)
Mitchell Moses [address] [place] Phone: [phone] Subjective:   [name], am serving as a scribe for Dr. Hulan Saas.  I'm seeing this patient by the request  of:  Kathyrn Lass, MD  CC: Right shoulder  [id number]  06/24/2022 Patient seems to be doing very well.  Continues to have some mild discomfort noted on the medial aspect of the knee.  Patient now feels like he has made some good progress.  Patient states not having any pain and does want to run again.  We will encourage him to try and see how he responds.  Worsening pain will need to consider the possibility of MRI if any worsening pain again but hopefully patient does fine.  Updated 07/03/2022 Mitchell Moses is a 41 y.o. male coming in with complaint of R shoulder pain. Had shoulder pain for a few months now. Pain not getting worse, just kind of there. Only pain with movement, mainly abduction, overhead exercises. Discomfort when laying on that side. Believes that he has had injections in that shoulder previously. No interventions attempted.       No past medical history on file. No past surgical history on file. Social History   Socioeconomic History   Marital status: Married    Spouse name: Not on file   Number of children: Not on file   Years of education: Not on file   Highest education level: Not on file  Occupational History   Not on file  Tobacco Use   Smoking status: Not on file   Smokeless tobacco: Not on file  Substance and Sexual Activity   Alcohol use: Not on file   Drug use: Not on file   Sexual activity: Not on file  Other Topics Concern   Not on file  Social History Narrative   Not on file   Social Determinants of Health   Financial Resource Strain: Not on file  Food Insecurity: Not on file  Transportation Needs: Not on file  Physical Activity: Not on file  Stress: Not on file  Social Connections: Not on file   Not on File No  family history on file.   Current Outpatient Medications (Cardiovascular):    amlodipine-atorvastatin (CADUET) 10-10 MG tablet, Take 1 tablet by mouth daily.   chlorthalidone (HYGROTON) 25 MG tablet, Take 25 mg by mouth daily.   losartan (COZAAR) 100 MG tablet, Take 100 mg by mouth daily.       Reviewed prior external information including notes and imaging from  primary care provider As well as notes that were available from care everywhere and other healthcare systems.  Past medical history, social, surgical and family history all reviewed in electronic medical record.  No pertanent information unless stated regarding to the chief complaint.   Review of Systems:  No headache, visual changes, nausea, vomiting, diarrhea, constipation, dizziness, abdominal pain, skin rash, fevers, chills, night sweats, weight loss, swollen lymph nodes, body aches, joint swelling, chest pain, shortness of breath, mood changes. POSITIVE muscle aches  Objective  Blood pressure 126/78, pulse 66, height 6' (1.829 m), weight 225 lb (102.1 kg), SpO2 99 %.   General: No apparent distress alert and oriented x3 mood and affect normal, dressed appropriately.  HEENT: Pupils equal, extraocular movements intact  Respiratory: Patient's speak in full sentences and does not appear short of breath  Cardiovascular: No lower extremity edema, non tender, no erythema  Right shoulder exam shows positive crossover test noted.  Tenderness to palpation of the acromioclavicular joint.  Patient does have 5/5 strength of the rotator cuff.   Limited muscular skeletal ultrasound was performed and interpreted by Hulan Saas, M  Hypoechoic changes in the acromioclavicular joint noted.  Patient does have some pain with compression in the area.  Rotator cuff does have some very mild degenerative changes of the supraspinatus but no true tearing appreciated. Impression: AC effusion  Procedure: Real-time Ultrasound Guided Injection  of right acromioclavicular joint Device: GE Logiq Q7 Ultrasound guided injection is preferred based studies that show increased duration, increased effect, greater accuracy, decreased procedural pain, increased response rate, and decreased cost with ultrasound guided versus blind injection.  Verbal informed consent obtained.  Time-out conducted.  Noted no overlying erythema, induration, or other signs of local infection.  Skin prepped in a sterile fashion.  Local anesthesia: Topical Ethyl chloride.  With sterile technique and under real time ultrasound guidance: With a 25-gauge half inch needle injected with 0.5 cc of 0.5% Marcaine and 0.5 cc of Kenalog 40 mg/mL. Completed without difficulty  Pain immediately resolved suggesting accurate placement of the medication.  Advised to call if fevers/chills, erythema, induration, drainage, or persistent bleeding.  Impression: Technically successful ultrasound guided injection.    Impression and Recommendations:     The above documentation has been reviewed and is accurate and complete Lyndal Pulley, DO

## 2022-07-03 ENCOUNTER — Encounter: Payer: Self-pay | Admitting: Family Medicine

## 2022-07-03 ENCOUNTER — Ambulatory Visit (INDEPENDENT_AMBULATORY_CARE_PROVIDER_SITE_OTHER): Payer: BC Managed Care – PPO | Admitting: Family Medicine

## 2022-07-03 ENCOUNTER — Ambulatory Visit: Payer: Self-pay

## 2022-07-03 VITALS — BP 126/78 | HR 66 | Ht 72.0 in | Wt 225.0 lb

## 2022-07-03 DIAGNOSIS — M25511 Pain in right shoulder: Secondary | ICD-10-CM

## 2022-07-03 DIAGNOSIS — M25411 Effusion, right shoulder: Secondary | ICD-10-CM

## 2022-07-03 DIAGNOSIS — S83241A Other tear of medial meniscus, current injury, right knee, initial encounter: Secondary | ICD-10-CM

## 2022-07-03 DIAGNOSIS — M79661 Pain in right lower leg: Secondary | ICD-10-CM | POA: Insufficient documentation

## 2022-07-03 NOTE — Assessment & Plan Note (Signed)
Undiagnosed new problem with uncertain prognosis.  We will get x-rays to further evaluate for any bony abnormalities that could be contributing.  We will send patient to formal physical therapy that I do think will be highly more beneficial and this will be referred and prescription given.  Decision regarding the injection was made today and identified patient's the risk factors that were available.  Patient will try further evaluation and management again at follow-up in 4 to 6 weeks

## 2022-07-03 NOTE — Patient Instructions (Addendum)
Injection in [facility] joint today PT referral

## 2022-07-03 NOTE — Assessment & Plan Note (Signed)
Patient brought up the idea of the calf strain.  Discussed with patient at great length.  Given heel lift and will be going to formal physical therapy to have it further evaluated for this also undiagnosed new problem with unknown prognosis.

## 2022-07-03 NOTE — Assessment & Plan Note (Signed)
Do believe patient is doing relatively well.  Worsening symptoms may need to consider advanced imaging still.

## 2022-07-10 DIAGNOSIS — S83241D Other tear of medial meniscus, current injury, right knee, subsequent encounter: Secondary | ICD-10-CM | POA: Diagnosis not present

## 2022-07-10 DIAGNOSIS — M25561 Pain in right knee: Secondary | ICD-10-CM | POA: Diagnosis not present

## 2022-07-10 DIAGNOSIS — M25511 Pain in right shoulder: Secondary | ICD-10-CM | POA: Diagnosis not present

## 2022-07-14 DIAGNOSIS — M25511 Pain in right shoulder: Secondary | ICD-10-CM | POA: Diagnosis not present

## 2022-07-14 DIAGNOSIS — M25561 Pain in right knee: Secondary | ICD-10-CM | POA: Diagnosis not present

## 2022-07-14 DIAGNOSIS — S83241D Other tear of medial meniscus, current injury, right knee, subsequent encounter: Secondary | ICD-10-CM | POA: Diagnosis not present

## 2022-07-21 DIAGNOSIS — S83241D Other tear of medial meniscus, current injury, right knee, subsequent encounter: Secondary | ICD-10-CM | POA: Diagnosis not present

## 2022-07-21 DIAGNOSIS — M25561 Pain in right knee: Secondary | ICD-10-CM | POA: Diagnosis not present

## 2022-07-21 DIAGNOSIS — M25511 Pain in right shoulder: Secondary | ICD-10-CM | POA: Diagnosis not present

## 2022-07-25 DIAGNOSIS — M25561 Pain in right knee: Secondary | ICD-10-CM | POA: Diagnosis not present

## 2022-07-25 DIAGNOSIS — M25511 Pain in right shoulder: Secondary | ICD-10-CM | POA: Diagnosis not present

## 2022-07-25 DIAGNOSIS — S83241D Other tear of medial meniscus, current injury, right knee, subsequent encounter: Secondary | ICD-10-CM | POA: Diagnosis not present

## 2022-08-07 DIAGNOSIS — Q613 Polycystic kidney, unspecified: Secondary | ICD-10-CM | POA: Diagnosis not present

## 2022-08-07 DIAGNOSIS — N281 Cyst of kidney, acquired: Secondary | ICD-10-CM | POA: Diagnosis not present

## 2022-08-07 DIAGNOSIS — I129 Hypertensive chronic kidney disease with stage 1 through stage 4 chronic kidney disease, or unspecified chronic kidney disease: Secondary | ICD-10-CM | POA: Diagnosis not present

## 2022-08-07 DIAGNOSIS — N182 Chronic kidney disease, stage 2 (mild): Secondary | ICD-10-CM | POA: Diagnosis not present

## 2022-08-08 DIAGNOSIS — M25511 Pain in right shoulder: Secondary | ICD-10-CM | POA: Diagnosis not present

## 2022-08-08 DIAGNOSIS — M25561 Pain in right knee: Secondary | ICD-10-CM | POA: Diagnosis not present

## 2022-08-08 DIAGNOSIS — S83241D Other tear of medial meniscus, current injury, right knee, subsequent encounter: Secondary | ICD-10-CM | POA: Diagnosis not present

## 2022-08-11 NOTE — Progress Notes (Deleted)
  [facility] [address] [place] [address] Phone: [phone] Subjective:    I'm seeing this patient by the request  of:  Sigmund Hazel, MD  CC:   [id number]  07/03/2022 Undiagnosed new problem with uncertain prognosis.  We will get x-rays to further evaluate for any bony abnormalities that could be contributing.  We will send patient to formal physical therapy that I do think will be highly more beneficial and this will be referred and prescription given.  Decision regarding the injection was made today and identified patient's the risk factors that were available.  Patient will try further evaluation and management again at follow-up in 4 to 6 weeks   Update 08/13/2022 Barry Faircloth is a 41 y.o. male coming in with complaint of R shoulder pain. Patient states        No past medical history on file. No past surgical history on file. Social History   Socioeconomic History   Marital status: Married    Spouse name: Not on file   Number of children: Not on file   Years of education: Not on file   Highest education level: Not on file  Occupational History   Not on file  Tobacco Use   Smoking status: Not on file   Smokeless tobacco: Not on file  Substance and Sexual Activity   Alcohol use: Not on file   Drug use: Not on file   Sexual activity: Not on file  Other Topics Concern   Not on file  Social History Narrative   Not on file   Social Determinants of Health   Financial Resource Strain: Not on file  Food Insecurity: Not on file  Transportation Needs: Not on file  Physical Activity: Not on file  Stress: Not on file  Social Connections: Not on file   Not on File No family history on file.   Current Outpatient Medications (Cardiovascular):    amlodipine-atorvastatin (CADUET) 10-10 MG tablet, Take 1 tablet by mouth daily.   chlorthalidone (HYGROTON) 25 MG tablet, Take 25 mg by mouth daily.   losartan (COZAAR) 100 MG  tablet, Take 100 mg by mouth daily.       Reviewed prior external information including notes and imaging from  primary care provider As well as notes that were available from care everywhere and other healthcare systems.  Past medical history, social, surgical and family history all reviewed in electronic medical record.  No pertanent information unless stated regarding to the chief complaint.   Review of Systems:  No headache, visual changes, nausea, vomiting, diarrhea, constipation, dizziness, abdominal pain, skin rash, fevers, chills, night sweats, weight loss, swollen lymph nodes, body aches, joint swelling, chest pain, shortness of breath, mood changes. POSITIVE muscle aches  Objective  There were no vitals taken for this visit.   General: No apparent distress alert and oriented x3 mood and affect normal, dressed appropriately.  HEENT: Pupils equal, extraocular movements intact  Respiratory: Patient's speak in full sentences and does not appear short of breath  Cardiovascular: No lower extremity edema, non tender, no erythema      Impression and Recommendations:

## 2022-08-13 ENCOUNTER — Ambulatory Visit: Payer: BC Managed Care – PPO | Admitting: Family Medicine

## 2022-12-31 ENCOUNTER — Ambulatory Visit (INDEPENDENT_AMBULATORY_CARE_PROVIDER_SITE_OTHER): Payer: BC Managed Care – PPO | Admitting: Sports Medicine

## 2022-12-31 ENCOUNTER — Other Ambulatory Visit: Payer: Self-pay

## 2022-12-31 ENCOUNTER — Encounter: Payer: Self-pay | Admitting: Family Medicine

## 2022-12-31 VITALS — BP 124/78 | Ht 72.0 in | Wt 227.0 lb

## 2022-12-31 DIAGNOSIS — M79662 Pain in left lower leg: Secondary | ICD-10-CM | POA: Diagnosis not present

## 2022-12-31 DIAGNOSIS — S86812A Strain of other muscle(s) and tendon(s) at lower leg level, left leg, initial encounter: Secondary | ICD-10-CM | POA: Diagnosis not present

## 2022-12-31 NOTE — Progress Notes (Signed)
    [facility] [address] [place] [address] Phone: [phone]   Assessment and Plan:     1. Pain of left calf 2. Strain of calf muscle, left, initial encounter -Acute, uncomplicated, initial sports medicine visit - Patient presents with pain in the left medial calf most consistent with strain of medial gastrocnemius muscle based on HPI, physical exam, ultrasound imaging - Recommend relative rest for the next 1 to 2 weeks with gradual increase in activity as tolerated - May have Tylenol/NSAIDs as needed  Sports Medicine: Musculoskeletal Ultrasound. Exam: Limited US of left calf Diagnosis: Left calf pain  US Findings: Thin line of hypoechoic stranding between medial gastrocnemius muscle and soleus.  No fiber dysregulation throughout gastroc or soleus musculature.  No hematoma.  US Impression:  Grade 1 gastroc strain      Pertinent previous records reviewed include none   Follow Up: As needed   Subjective:   I, Moenique Parris, am serving as a Neurosurgeon for Doctor Richardean Sale  Chief Complaint: calf pain   HPI:   12/31/22 Patient is a 42 year old male complaining of calf pain. Patient states that he felt his calf pop this morning, he has antalgic gait , no numbness or tingling, no bruising, no bulging or retracting, no radiating pain, no meds for the pain , was doing crossfit this morning   Relevant Historical Information: None pertinent  Additional pertinent review of systems negative.   Current Outpatient Medications:    chlorthalidone (HYGROTON) 25 MG tablet, Take 25 mg by mouth daily., Disp: , Rfl:    losartan (COZAAR) 100 MG tablet, Take 100 mg by mouth daily., Disp: , Rfl:    amlodipine-atorvastatin (CADUET) 10-10 MG tablet, Take 1 tablet by mouth daily., Disp: , Rfl:    Objective:     Vitals:   12/31/22 0958  BP: 124/78  Weight: 227 lb (103 kg)  Height: 6' (1.829 m)      Body mass index is 30.79  kg/m.    Physical Exam:    General:  awake, alert oriented, no acute distress nontoxic Skin: no suspicious lesions or rashes Neuro:sensation intact and strength 5/5 with no deficits, no atrophy, normal muscle tone Psych: No signs of anxiety, depression or other mood disorder  Left leg: Mild diffuse swelling around the calf muscle, slightly increased compared to right.  No discoloration or deformity   Neg fluid wave, joint milking Knee ROM Flex 110, Ext 0 TTP medial gastroc NTTP Achilles tendon, calcaneus, midline gastroc, lateral gastroc unable to do double leg weightbearing plantarflexion due to pain in left calf Gait antalgic, favoring right leg   Electronically signed by:  [facility] 10:23 AM 12/31/22

## 2022-12-31 NOTE — Patient Instructions (Signed)
Good to see you   

## 2023-02-27 DIAGNOSIS — Z23 Encounter for immunization: Secondary | ICD-10-CM | POA: Diagnosis not present

## 2023-02-27 DIAGNOSIS — I10 Essential (primary) hypertension: Secondary | ICD-10-CM | POA: Diagnosis not present

## 2023-02-27 DIAGNOSIS — Q613 Polycystic kidney, unspecified: Secondary | ICD-10-CM | POA: Diagnosis not present

## 2023-03-04 DIAGNOSIS — R0982 Postnasal drip: Secondary | ICD-10-CM | POA: Diagnosis not present

## 2023-03-04 DIAGNOSIS — R0981 Nasal congestion: Secondary | ICD-10-CM | POA: Diagnosis not present

## 2023-03-04 DIAGNOSIS — J329 Chronic sinusitis, unspecified: Secondary | ICD-10-CM | POA: Diagnosis not present

## 2023-03-13 ENCOUNTER — Encounter: Payer: Self-pay | Admitting: Family Medicine

## 2023-03-17 NOTE — Progress Notes (Signed)
[facility] [address] [place] [address] Phone: [phone]   Assessment and Plan:     1. Lateral epicondylitis of right elbow -Acute, uncomplicated, initial sports medicine visit - Most consistent with lateral epicondylitis based on HPI and physical exam.  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 Pain in right acromioclavicular joint -Chronic with exacerbation, subsequent visit - Consistent with recurrent flare of AC joint pain.  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  Could consider AC joint CSI which was beneficial for patient after injection on 07/03/2022   Subjective:   I, Moenique Parris, am serving as a Neurosurgeon for Doctor Richardean Sale  Chief Complaint: shoulder   HPI:  06/24/2022 Patient seems to be doing very well.  Continues to have some mild discomfort noted on the medial aspect of the knee.  Patient now feels like he has made some good progress.  Patient states not having any pain and does want to run again.  We will encourage him to try and see how he responds.  Worsening pain will need to consider the possibility of MRI if any worsening pain again but hopefully patient does fine.   Updated 07/03/2022 Hyrum Canter is a 42 y.o. male coming in with complaint of R shoulder pain. Had shoulder pain for a few months now. Pain not getting worse, just kind of there. Only pain with movement, mainly abduction, overhead exercises. Discomfort when laying on that side. Believes that he has had injections in that shoulder previously. No interventions  attempted.  03/24/2023 Patient is a 42 year old male complaining of shoulder pain. Patient states that his shoulder  pain for a couple of months pain radiates down to the elbow and the hand. Hx of CSI 07/03/2022 and would like another one, he has noticed a decrease in grip strength, no numbness or tingling, no meds for the pain.   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  Current Outpatient Medications  Medication Sig Dispense Refill   amlodipine-atorvastatin (CADUET) 10-10 MG tablet Take 1 tablet by mouth daily.     chlorthalidone (HYGROTON) 25 MG tablet Take 25 mg by mouth daily.     losartan (COZAAR) 100 MG tablet Take 100 mg by mouth daily.     methylPREDNISolone (MEDROL DOSEPAK) 4 MG TBPK tablet Take 6 tablets on day 1.  Take 5 tablets on day 2.  Take 4 tablets on day 3.  Take 3 tablets on day 4.  Take 2 tablets on day 5.  Take 1 tablet on day 6. 21 tablet 0   No current facility-administered medications for this visit.      Objective:     Vitals:   03/24/23 1253  BP: 110/80  Pulse: 86  SpO2: 99%  Weight: 233 lb (105.7 kg)  Height: 6' (1.829 m)      Body mass index is 31.6 kg/m.    Physical Exam:     General: Appears well, no acute distress, nontoxic and pleasant Neck: FROM, no pain Neuro: sensation is intact distally  with no deficits, strenghth is 5/5 in elbow flexors/extenders/supinator/pronators and wrist flexors/extensors Psych: no evidence of anxiety or depression  Right elbow/arm: No deformity, swelling or muscle wasting Normal Carrying angle TTP AC joint Elbow ROM:0-140, supination and pronation 90 Lateral epicondyle, supinators NTTP over triceps, ticeps tendon, olecronon,  medial epicondyle, antecubital fossa, biceps tendon,  pronator Negative tinnels over cubital tunnel   pain with resisted wrist and middle digit extension No pain with resisted wrist flexion Mild pain with resisted  supination No pain with resisted pronation Negative valgus stress Negative varus stress Negative milking maneuver   Electronically signed by:  [facility]leen [facility] 1:13 PM 03/24/23

## 2023-03-24 ENCOUNTER — Ambulatory Visit (INDEPENDENT_AMBULATORY_CARE_PROVIDER_SITE_OTHER): Payer: BC Managed Care – PPO | Admitting: Sports Medicine

## 2023-03-24 VITALS — BP 110/80 | HR 86 | Ht 72.0 in | Wt 233.0 lb

## 2023-03-24 DIAGNOSIS — M25511 Pain in right shoulder: Secondary | ICD-10-CM | POA: Diagnosis not present

## 2023-03-24 DIAGNOSIS — M7711 Lateral epicondylitis, right elbow: Secondary | ICD-10-CM | POA: Diagnosis not present

## 2023-03-24 MED ORDER — METHYLPREDNISOLONE 4 MG PO TBPK: ORAL_TABLET | ORAL | 0 refills | Status: AC

## 2023-03-24 NOTE — Patient Instructions (Addendum)
Elbow pain HEP  Prednisone dos pak  As needed follow up

## 2023-05-06 NOTE — Progress Notes (Unsigned)
    [facility] [address] [place] [address] Phone: [phone]   Assessment and Plan:     There are no diagnoses linked to this encounter.  ***   Pertinent previous records reviewed include ***   Follow Up: ***     Subjective:   I,  , am serving as a Neurosurgeon for Doctor Richardean Sale  Chief Complaint: right  shoulder    HPI:  06/24/2022 Patient seems to be doing very well.  Continues to have some mild discomfort noted on the medial aspect of the knee.  Patient now feels like he has made some good progress.  Patient states not having any pain and does want to run again.  We will encourage him to try and see how he responds.  Worsening pain will need to consider the possibility of MRI if any worsening pain again but hopefully patient does fine.   Updated 07/03/2022 Hozie Roubal is a 42 y.o. male coming in with complaint of R shoulder pain. Had shoulder pain for a few months now. Pain not getting worse, just kind of there. Only pain with movement, mainly abduction, overhead exercises. Discomfort when laying on that side. Believes that he has had injections in that shoulder previously. No interventions attempted.   03/24/2023 Patient is a 42 year old male complaining of shoulder pain. Patient states that his shoulder  pain for a couple of months pain radiates down to the elbow and the hand. Hx of CSI 07/03/2022 and would like another one, he has noticed a decrease in grip strength, no numbness or tingling, no meds for the pain.   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   Current Outpatient Medications:    amlodipine-atorvastatin (CADUET) 10-10 MG tablet, Take 1 tablet by mouth daily., Disp: , Rfl:    chlorthalidone (HYGROTON) 25 MG tablet, Take 25 mg by mouth daily., Disp: , Rfl:    losartan (COZAAR) 100 MG  tablet, Take 100 mg by mouth daily., Disp: , Rfl:    methylPREDNISolone (MEDROL DOSEPAK) 4 MG TBPK tablet, Take 6 tablets on day 1.  Take 5 tablets on day 2.  Take 4 tablets on day 3.  Take 3 tablets on day 4.  Take 2 tablets on day 5.  Take 1 tablet on day 6., Disp: 21 tablet, Rfl: 0   Objective:     There were no vitals filed for this visit.    There is no height or weight on file to calculate BMI.    Physical Exam:    ***   Electronically signed by:  Aleen [facility]ells [facility] 7:18 AM 05/06/23

## 2023-05-07 ENCOUNTER — Other Ambulatory Visit: Payer: Self-pay

## 2023-05-07 ENCOUNTER — Ambulatory Visit (INDEPENDENT_AMBULATORY_CARE_PROVIDER_SITE_OTHER): Payer: BC Managed Care – PPO | Admitting: Sports Medicine

## 2023-05-07 VITALS — HR 61 | Ht 72.0 in | Wt 233.0 lb

## 2023-05-07 DIAGNOSIS — M25511 Pain in right shoulder: Secondary | ICD-10-CM | POA: Diagnosis not present

## 2023-05-07 NOTE — Patient Instructions (Signed)
2-4 week follow up for MS[facility]

## 2023-05-08 NOTE — Progress Notes (Signed)
    Mitchell Moses [facility] [address] [place] [address] Phone: [phone]   Assessment and Plan:     1. Chronic bilateral low back pain without sciatica 2. Somatic dysfunction of cervical region 3. Somatic dysfunction of thoracic region 4. Somatic dysfunction of lumbar region 5. Somatic dysfunction of pelvic region 6. Somatic dysfunction of rib region  -Chronic with exacerbation, initial sports medicine visit - Patient presenting with chronic tightness and soreness primarily in low back, though also in cervical thoracic junction without red flag symptoms - Recommend starting HEP for low back and hip flexor - Patient elected for initial OMT today.  Tolerated well per note below. - Decision today to treat with OMT was based on Physical Exam  After verbal consent patient was treated with HVLA (high velocity low amplitude), ME (muscle energy), FPR (flex positional release), ST (soft tissue), PC/PD (Pelvic Compression/ Pelvic Decompression) techniques in cervical, rib, thoracic, lumbar, and pelvic areas. Patient tolerated the procedure well with improvement in symptoms.  Patient educated on potential side effects of soreness and recommended to rest, hydrate, and use Tylenol as needed for pain control.   Pertinent previous records reviewed include none   Follow Up: 4 weeks for reevaluation.  Could consider repeat OMT   Subjective:   I, Mitchell Moses, am serving as a Neurosurgeon for Doctor [employer]  Chief Complaint: OMT  HPI:   05/15/2023 Lower back pain, bilateral. Denies radiating pain into glutes, hips, groin. Notes general tightness throughout the spine. Having neck pain, bilateral, decreased ROM, tightness. Denies HA associated with neck pain. Denies sharp shooting pain, n/t, weakness in B UE or B LE. Mid-back pain and tightness, denies bowel bladder dysfunction. No meds for pain. Does [employer] regularly.   Relevant Historical  Information: None pertinent  Additional pertinent review of systems negative.   Current Outpatient Medications:    amlodipine-atorvastatin (CADUET) 10-10 MG tablet, Take 1 tablet by mouth daily., Disp: , Rfl:    chlorthalidone (HYGROTON) 25 MG tablet, Take 25 mg by mouth daily., Disp: , Rfl:    losartan (COZAAR) 100 MG tablet, Take 100 mg by mouth daily., Disp: , Rfl:    methylPREDNISolone (MEDROL DOSEPAK) 4 MG TBPK tablet, Take 6 tablets on day 1.  Take 5 tablets on day 2.  Take 4 tablets on day 3.  Take 3 tablets on day 4.  Take 2 tablets on day 5.  Take 1 tablet on day 6., Disp: 21 tablet, Rfl: 0   Objective:     Vitals:   05/15/23 0800  BP: 118/78  Pulse: 62  SpO2: 100%  Weight: 223 lb (101.2 kg)  Height: 6' (1.829 m)      Body mass index is 30.24 kg/m.    Physical Exam:    General: Well-appearing, cooperative, sitting comfortably in no acute distress.   OMT Physical Exam:  ASIS Compression Test: Positive Right Cervical: TTP paraspinal, C3 RLSR Rib: Bilateral elevated first rib with mild TTP Thoracic: TTP paraspinal, T4-6 RLSR, T7-9 RRSL Lumbar: TTP paraspinal, no specific dysfunction, though reduced rotation bilaterally Pelvis: Right anterior innominate    Electronically signed by:  Mitchell [facility] 9:05 AM 05/15/23

## 2023-05-11 ENCOUNTER — Ambulatory Visit: Payer: BC Managed Care – PPO | Admitting: Sports Medicine

## 2023-05-15 ENCOUNTER — Encounter: Payer: Self-pay | Admitting: Sports Medicine

## 2023-05-15 ENCOUNTER — Ambulatory Visit (INDEPENDENT_AMBULATORY_CARE_PROVIDER_SITE_OTHER): Payer: BC Managed Care – PPO | Admitting: Sports Medicine

## 2023-05-15 VITALS — BP 118/78 | HR 62 | Ht 72.0 in | Wt 223.0 lb

## 2023-05-15 DIAGNOSIS — M9901 Segmental and somatic dysfunction of cervical region: Secondary | ICD-10-CM | POA: Diagnosis not present

## 2023-05-15 DIAGNOSIS — M9903 Segmental and somatic dysfunction of lumbar region: Secondary | ICD-10-CM | POA: Diagnosis not present

## 2023-05-15 DIAGNOSIS — M9908 Segmental and somatic dysfunction of rib cage: Secondary | ICD-10-CM

## 2023-05-15 DIAGNOSIS — M9905 Segmental and somatic dysfunction of pelvic region: Secondary | ICD-10-CM

## 2023-05-15 DIAGNOSIS — M545 Low back pain, unspecified: Secondary | ICD-10-CM

## 2023-05-15 DIAGNOSIS — G8929 Other chronic pain: Secondary | ICD-10-CM | POA: Diagnosis not present

## 2023-05-15 DIAGNOSIS — M9902 Segmental and somatic dysfunction of thoracic region: Secondary | ICD-10-CM | POA: Diagnosis not present

## 2023-05-15 NOTE — Patient Instructions (Signed)
Thank you for coming in.  Follow-up in 4 weeks  Snapping Hip Syndrome Rehab Ask your health care provider which exercises are safe for you. Do exercises exactly as told by your health care provider and adjust them as directed. It is normal to feel mild stretching, pulling, tightness, or discomfort as you do these exercises. Stop right away if you feel sudden pain or your pain gets worse. Do not begin these exercises until told by your health care provider. Stretching and range-of-motion exercises These exercises warm up your muscles and joints and improve the movement and flexibility of your hip and pelvis. These exercises also help to relieve pain and stiffness. Hip rotators Hip rotators are the muscles that keep the hip joint together. Lie on your back on a firm surface. With your left / right hand, gently pull your left / right knee toward the shoulder that is on the same side of the body. Stop when your knee is pointing toward the ceiling. Hold your left / right ankle with your other hand. Keeping your knee steady, gently pull your left / right ankle toward your other shoulder until you feel a stretch in your buttocks. Keep your hips and shoulders firmly planted while you do this stretch. Hold this position for __________ seconds. Slowly return to the starting position. Repeat __________ times. Complete this exercise __________ times a day. Iliotibial band stretch An iliotibial band is a strong band of tissue that runs from the outer side of your hip to the outer side of your thigh and knee. Lie on your side with your left / right leg in the top position. Bend your left / right knee and grab your ankle. Stretch out your bottom arm to help you balance. Slowly bring your knee back so your thigh is behind your body. Slowly lower your knee toward the floor until you feel a gentle stretch on the outside of your left / right thigh. If you do not feel a stretch and your knee will not fall  farther, place the heel of your other foot on top of your knee and pull your knee down toward the floor with your foot. Hold this position for __________ seconds. Slowly return to the starting position. Repeat __________ times. Complete this exercise __________ times a day. Lunge This exercise is also called a hip flexor stretch. Place your left / right knee on the floor and bend your other knee so that it is directly over your ankle. You should be half-kneeling. Keep good posture with your head over your shoulders. Tighten your buttocks to point your tailbone downward. This will prevent your back from arching too much. You should feel a gentle stretch in the front of your left / right thigh and hip (hip flexors). If you do not feel a stretch, slide your front foot forward slightly and then slowly lunge forward with your chest up until your knee once again lines up over your ankle. Make sure your tailbone continues to point downward. Hold this position for __________ seconds. Slowly return to the starting position. Repeat __________ times. Complete this exercise __________ times a day. Strengthening exercises These exercises build strength and endurance in your hip and pelvis. Endurance is the ability to use your muscles for a long time, even after they get tired. Straight leg raises, side-lying This exercise is sometimes called a hip abductor exercise. It strengthens the muscles that rotate the leg at the hip and move it away from your body (hip abduction). Mitchell Moses  on your side with your left / right leg in the top position. Lie so your head, shoulder, hip, and knee line up. Bend your bottom knee slightly to help you balance. Lift your top leg 4-6 inches (10-15 cm) while keeping your toes pointed straight ahead. Hold this position for __________ seconds. Slowly lower your leg to the starting position. Let your muscles relax completely after each repetition. Repeat __________ times. Complete this  exercise __________ times a day. Hip abductors and rotators, quadruped This exercise strengthens the muscles that keep the hip together (hip rotators) along with the muscles that move the leg and hip away from your body (hip abductors). Get on your hands and knees on a firm, lightly padded surface. This is the quadruped position. Your hands should be directly below your shoulders, and your knees should be directly below your hips. Lift your left / right knee out to the side. Keep your knee bent. Do not twist your body. Hold this position for __________ seconds. Slowly lower your leg back to the starting position. Repeat __________ times. Complete this exercise __________ times a day. This information is not intended to replace advice given to you by your health care provider. Make sure you discuss any questions you have with your health care provider. Document Revised: 01/17/2022 Document Reviewed: 01/17/2022 Elsevier Patient Education  2024 [employer] Back Exercises The following exercises strengthen the muscles that help to support the trunk (torso) and back. They also help to keep the lower back flexible. Doing these exercises can help to prevent or lessen existing low back pain. If you have back pain or discomfort, try doing these exercises 2-3 times each day or as told by your health care provider. As your pain improves, do them once each day, but increase the number of times that you repeat the steps for each exercise (do more repetitions). To prevent the recurrence of back pain, continue to do these exercises once each day or as told by your health care provider. Do exercises exactly as told by your health care provider and adjust them as directed. It is normal to feel mild stretching, pulling, tightness, or discomfort as you do these exercises, but you should stop right away if you feel sudden pain or your pain gets worse. Exercises Single knee to chest Repeat these steps 3-5 times for  each leg: Lie on your back on a firm bed or the floor with your legs extended. Bring one knee to your chest. Your other leg should stay extended and in contact with the floor. Hold your knee in place by grabbing your knee or thigh with both hands and hold. Pull on your knee until you feel a gentle stretch in your lower back or buttocks. Hold the stretch for 10-30 seconds. Slowly release and straighten your leg.  Pelvic tilt Repeat these steps 5-10 times: Lie on your back on a firm bed or the floor with your legs extended. Bend your knees so they are pointing toward the ceiling and your feet are flat on the floor. Tighten your lower abdominal muscles to press your lower back against the floor. This motion will tilt your pelvis so your tailbone points up toward the ceiling instead of pointing to your feet or the floor. With gentle tension and even breathing, hold this position for 5-10 seconds.  Cat-cow Repeat these steps until your lower back becomes more flexible: Get into a hands-and-knees position on a firm bed or the floor. Keep your hands under your  shoulders, and keep your knees under your hips. You may place padding under your knees for comfort. Let your head hang down toward your chest. Contract your abdominal muscles and point your tailbone toward the floor so your lower back becomes rounded like the back of a cat. Hold this position for 5 seconds. Slowly lift your head, let your abdominal muscles relax, and point your tailbone up toward the ceiling so your back forms a sagging arch like the back of a cow. Hold this position for 5 seconds.  Press-ups Repeat these steps 5-10 times: Lie on your abdomen (face-down) on a firm bed or the floor. Place your palms near your head, about shoulder-width apart. Keeping your back as relaxed as possible and keeping your hips on the floor, slowly straighten your arms to raise the top half of your body and lift your shoulders. Do not use your back  muscles to raise your upper torso. You may adjust the placement of your hands to make yourself more comfortable. Hold this position for 5 seconds while you keep your back relaxed. Slowly return to lying flat on the floor.  Bridges Repeat these steps 10 times: Lie on your back on a firm bed or the floor. Bend your knees so they are pointing toward the ceiling and your feet are flat on the floor. Your arms should be flat at your sides, next to your body. Tighten your buttocks muscles and lift your buttocks off the floor until your waist is at almost the same height as your knees. You should feel the muscles working in your buttocks and the back of your thighs. If you do not feel these muscles, slide your feet 1-2 inches (2.5-5 cm) farther away from your buttocks. Hold this position for 3-5 seconds. Slowly lower your hips to the starting position, and allow your buttocks muscles to relax completely. If this exercise is too easy, try doing it with your arms crossed over your chest. Abdominal crunches Repeat these steps 5-10 times: Lie on your back on a firm bed or the floor with your legs extended. Bend your knees so they are pointing toward the ceiling and your feet are flat on the floor. Cross your arms over your chest. Tip your chin slightly toward your chest without bending your neck. Tighten your abdominal muscles and slowly raise your torso high enough to lift your shoulder blades a tiny bit off the floor. Avoid raising your torso higher than that because it can put too much stress on your lower back and does not help to strengthen your abdominal muscles. Slowly return to your starting position.  Back lifts Repeat these steps 5-10 times: Lie on your abdomen (face-down) with your arms at your sides, and rest your forehead on the floor. Tighten the muscles in your legs and your buttocks. Slowly lift your chest off the floor while you keep your hips pressed to the floor. Keep the back of your  head in line with the curve in your back. Your eyes should be looking at the floor. Hold this position for 3-5 seconds. Slowly return to your starting position.  Contact a health care provider if: Your back pain or discomfort gets much worse when you do an exercise. Your worsening back pain or discomfort does not lessen within 2 hours after you exercise. If you have any of these problems, stop doing these exercises right away. Do not do them again unless your health care provider says that you can. Get help right away  if: You develop sudden, severe back pain. If this happens, stop doing the exercises right away. Do not do them again unless your health care provider says that you can. This information is not intended to replace advice given to you by your health care provider. Make sure you discuss any questions you have with your health care provider. Document Revised: 10/12/2022 Document Reviewed: 11/21/2020 Elsevier Patient Education  2024 [employer].

## 2023-06-11 NOTE — Progress Notes (Signed)
   Aleen Sells D.[facility] [address] [place] [address] Phone: [phone]   Assessment and Plan:     1. Chronic bilateral low back pain without sciatica 2. Somatic dysfunction of cervical region 3. Somatic dysfunction of thoracic region 4. Somatic dysfunction of lumbar region 5. Somatic dysfunction of pelvic region 6. Somatic dysfunction of rib region  -Chronic with exacerbation, subsequent visit - Overall improvement with OMT, however patient continues to have muscular dysfunction primarily in low back - Patient has received relief with OMT in the past.  Elects for repeat OMT today.  Tolerated well per note below. - Decision today to treat with OMT was based on Physical Exam   After verbal consent patient was treated with HVLA (high velocity low amplitude), ME (muscle energy), FPR (flex positional release), ST (soft tissue), PC/PD (Pelvic Compression/ Pelvic Decompression) techniques in cervical, rib, thoracic, lumbar, and pelvic areas. Patient tolerated the procedure well with improvement in symptoms.  Patient educated on potential side effects of soreness and recommended to rest, hydrate, and use Tylenol as needed for pain control.   Pertinent previous records reviewed include none   Follow Up: 3 weeks for reevaluation.  Could consider repeat OMT.  If pain continues along right biceps tendon despite Voltaren gel use, could consider ultrasound versus CSI   Subjective:   I, Mitchell Moses, am serving as a Neurosurgeon for [employer]   Chief Complaint: OMT   HPI:    05/15/2023 Lower back pain, bilateral. Denies radiating pain into glutes, hips, groin. Notes general tightness throughout the spine. Having neck pain, bilateral, decreased ROM, tightness. Denies HA associated with neck pain. Denies sharp shooting pain, n/t, weakness in B UE or B LE. Mid-back pain and tightness, denies bowel bladder dysfunction. No meds for pain. Does [employer]  regularly.   06/12/2023 Patient states here for a tune up . He states he has a pain in his bicep   Relevant Historical Information: None pertinent  Additional pertinent review of systems negative.  Current Outpatient Medications  Medication Sig Dispense Refill   amlodipine-atorvastatin (CADUET) 10-10 MG tablet Take 1 tablet by mouth daily.     chlorthalidone (HYGROTON) 25 MG tablet Take 25 mg by mouth daily.     losartan (COZAAR) 100 MG tablet Take 100 mg by mouth daily.     methylPREDNISolone (MEDROL DOSEPAK) 4 MG TBPK tablet Take 6 tablets on day 1.  Take 5 tablets on day 2.  Take 4 tablets on day 3.  Take 3 tablets on day 4.  Take 2 tablets on day 5.  Take 1 tablet on day 6. 21 tablet 0   No current facility-administered medications for this visit.      Objective:     Vitals:   06/12/23 0812  BP: 120/80  Pulse: 67  SpO2: 97%  Weight: 220 lb (99.8 kg)  Height: 6' (1.829 m)      Body mass index is 29.84 kg/m.    Physical Exam:     General: Well-appearing, cooperative, sitting comfortably in no acute distress.   OMT Physical Exam:  ASIS Compression Test: Positive Right Cervical: TTP paraspinal, C3 RRSL Rib: Bilateral non elevated first rib with NTTP Thoracic: TTP paraspinal, T5-7 RRSL Lumbar: TTP paraspinal, L2 RLSL Pelvis: Right anterior innominate  Electronically signed by:  Aleen Sells [facility] 9:04 AM 06/12/23

## 2023-06-12 ENCOUNTER — Ambulatory Visit (INDEPENDENT_AMBULATORY_CARE_PROVIDER_SITE_OTHER): Payer: BC Managed Care – PPO | Admitting: Sports Medicine

## 2023-06-12 VITALS — BP 120/80 | HR 67 | Ht 72.0 in | Wt 220.0 lb

## 2023-06-12 DIAGNOSIS — M545 Low back pain, unspecified: Secondary | ICD-10-CM

## 2023-06-12 DIAGNOSIS — M9905 Segmental and somatic dysfunction of pelvic region: Secondary | ICD-10-CM

## 2023-06-12 DIAGNOSIS — M9908 Segmental and somatic dysfunction of rib cage: Secondary | ICD-10-CM

## 2023-06-12 DIAGNOSIS — M9901 Segmental and somatic dysfunction of cervical region: Secondary | ICD-10-CM

## 2023-06-12 DIAGNOSIS — G8929 Other chronic pain: Secondary | ICD-10-CM | POA: Diagnosis not present

## 2023-06-12 DIAGNOSIS — M9902 Segmental and somatic dysfunction of thoracic region: Secondary | ICD-10-CM

## 2023-06-12 DIAGNOSIS — M9903 Segmental and somatic dysfunction of lumbar region: Secondary | ICD-10-CM

## 2023-07-02 NOTE — Progress Notes (Signed)
Aleen [facility]ells [facility] [address] [place] [address] Phone: [phone]   Assessment and Plan:     1. Chronic right shoulder pain 2. Biceps tendinitis, right  -Chronic with exacerbation, subsequent visit - Recurrent right shoulder pain, most consistent with right biceps tendinitis based on physical exam and HPI.  Patient has previously had AC joint pain, however this pain has been well-controlled since [facility] CSI on 05/07/2023 - Patient elected for biceps tendon CSI.  Tolerated well per note below - May continue activity as tolerated  Procedure: Ultrasound Guided Biceps Tendon Sheath Injection Side: Right Diagnosis: Biceps tendinitis [place] Indication:  - accuracy is paramount for diagnosis - to ensure therapeutic efficacy or procedural success - to reduce procedural risk  After explaining the procedure, viable alternatives, risks, and answering any questions, consent was given verbally. The site was cleaned with chlorhexidine prep. An ultrasound transducer was placed on the anterior shoulder.  The biceps tendon and bicipital groove was identified.  A steroid injection was performed under ultrasound guidance with sterile technique using  1ml of 1% lidocaine without epinephrine and 40 mg of triamcinolone (KENALOG) 40mg /ml. This was well tolerated and resulted in  relief.  Needle was removed and dressing placed and post injection instructions were given including  a discussion of likely return of pain today after the anesthetic wears off (with the possibility of worsened pain) until the steroid starts to work in 1-3 days.   Pt was advised to call or return to clinic if these symptoms worsen or fail to improve as anticipated.   Pertinent previous records reviewed include none   Follow Up: 3 to 4 weeks for reevaluation.  Could consider repeat OMT   Subjective:   I, Moenique Parris, am serving as a Neurosurgeon for Doctor [employer]   Chief Complaint: OMT    HPI:    05/15/2023 Lower back pain, bilateral. Denies radiating pain into glutes, hips, groin. Notes general tightness throughout the spine. Having neck pain, bilateral, decreased ROM, tightness. Denies HA associated with neck pain. Denies sharp shooting pain, n/t, weakness in B UE or B LE. Mid-back pain and tightness, denies bowel bladder dysfunction. No meds for pain. Does [employer] regularly.    06/12/2023 Patient states here for a tune up . He states he has a pain in his bicep  07/03/2023 Patient states here for a tune up    Relevant Historical Information: None pertinent  Additional pertinent review of systems negative.  Current Outpatient Medications  Medication Sig Dispense Refill   amlodipine-atorvastatin (CADUET) 10-10 MG tablet Take 1 tablet by mouth daily.     chlorthalidone (HYGROTON) 25 MG tablet Take 25 mg by mouth daily.     losartan (COZAAR) 100 MG tablet Take 100 mg by mouth daily.     methylPREDNISolone (MEDROL DOSEPAK) 4 MG TBPK tablet Take 6 tablets on day 1.  Take 5 tablets on day 2.  Take 4 tablets on day 3.  Take 3 tablets on day 4.  Take 2 tablets on day 5.  Take 1 tablet on day 6. 21 tablet 0   No current facility-administered medications for this visit.      Objective:     Vitals:   07/03/23 0802  Pulse: (!) 58  SpO2: 100%  Weight: 227 lb (103 kg)  Height: 6' (1.829 m)      Body mass index is 30.79 kg/m.    Physical Exam:     Gen: Appears well,  nad, nontoxic and pleasant Neuro:sensation intact, strength is 5/5 with df/pf/inv/ev, muscle tone wnl Skin: no suspicious lesion or defmority Psych: A&O, appropriate mood and affect  Right shoulder:  No deformity, swelling or muscle wasting No scapular winging FF 180, abd 180, int 0, ext 90 NTTP over the , clavicle, ac, coracoid, biceps groove, humerus, deltoid, trapezius, cervical spine Neg neer, hawkins, empty can, obriens, crossarm, subscap liftoff, Positive speeds Neg ant drawer, sulcus sign,  apprehension Negative Spurling's test bilat FROM of neck   Electronically signed by:  Aleen Sells [facility] 8:31 AM 07/03/23

## 2023-07-03 ENCOUNTER — Other Ambulatory Visit: Payer: Self-pay

## 2023-07-03 ENCOUNTER — Ambulatory Visit: Payer: BC Managed Care – PPO | Admitting: Sports Medicine

## 2023-07-03 VITALS — HR 58 | Ht 72.0 in | Wt 227.0 lb

## 2023-07-03 DIAGNOSIS — M7521 Bicipital tendinitis, right shoulder: Secondary | ICD-10-CM

## 2023-07-03 DIAGNOSIS — G8929 Other chronic pain: Secondary | ICD-10-CM

## 2023-07-03 DIAGNOSIS — M25511 Pain in right shoulder: Secondary | ICD-10-CM

## 2023-07-10 NOTE — Progress Notes (Deleted)
    Aleen [facility]ells [facility] [address] [place] [address] Phone: [phone]   Assessment and Plan:     There are no diagnoses linked to this encounter.  ***   Pertinent previous records reviewed include ***   Follow Up: ***     Subjective:   I, Donnell Beauchamp, am serving as a Neurosurgeon for Doctor [employer]   Chief Complaint: OMT   HPI:    05/15/2023 Lower back pain, bilateral. Denies radiating pain into glutes, hips, groin. Notes general tightness throughout the spine. Having neck pain, bilateral, decreased ROM, tightness. Denies HA associated with neck pain. Denies sharp shooting pain, n/t, weakness in B UE or B LE. Mid-back pain and tightness, denies bowel bladder dysfunction. No meds for pain. Does [employer] regularly.    06/12/2023 Patient states here for a tune up . He states he has a pain in his bicep   07/03/2023 Patient states here for a tune up   07/24/2023  Patient states  Relevant Historical Information: None pertinent  Additional pertinent review of systems negative.   Current Outpatient Medications:    amlodipine-atorvastatin (CADUET) 10-10 MG tablet, Take 1 tablet by mouth daily., Disp: , Rfl:    chlorthalidone (HYGROTON) 25 MG tablet, Take 25 mg by mouth daily., Disp: , Rfl:    losartan (COZAAR) 100 MG tablet, Take 100 mg by mouth daily., Disp: , Rfl:    methylPREDNISolone (MEDROL DOSEPAK) 4 MG TBPK tablet, Take 6 tablets on day 1.  Take 5 tablets on day 2.  Take 4 tablets on day 3.  Take 3 tablets on day 4.  Take 2 tablets on day 5.  Take 1 tablet on day 6., Disp: 21 tablet, Rfl: 0   Objective:     There were no vitals filed for this visit.    There is no height or weight on file to calculate BMI.    Physical Exam:    ***   Electronically signed by:  Aleen Sells [facility] 7:41 AM 07/10/23

## 2023-07-23 ENCOUNTER — Encounter: Payer: Self-pay | Admitting: Sports Medicine

## 2023-07-24 ENCOUNTER — Ambulatory Visit: Payer: BC Managed Care – PPO | Admitting: Sports Medicine

## 2023-08-13 DIAGNOSIS — N182 Chronic kidney disease, stage 2 (mild): Secondary | ICD-10-CM | POA: Diagnosis not present

## 2023-08-13 DIAGNOSIS — N281 Cyst of kidney, acquired: Secondary | ICD-10-CM | POA: Diagnosis not present

## 2023-08-13 DIAGNOSIS — I129 Hypertensive chronic kidney disease with stage 1 through stage 4 chronic kidney disease, or unspecified chronic kidney disease: Secondary | ICD-10-CM | POA: Diagnosis not present

## 2023-08-13 DIAGNOSIS — Q613 Polycystic kidney, unspecified: Secondary | ICD-10-CM | POA: Diagnosis not present

## 2023-08-13 DIAGNOSIS — I10 Essential (primary) hypertension: Secondary | ICD-10-CM | POA: Diagnosis not present

## 2024-02-16 DIAGNOSIS — Z3009 Encounter for other general counseling and advice on contraception: Secondary | ICD-10-CM | POA: Diagnosis not present

## 2024-03-14 DIAGNOSIS — Q613 Polycystic kidney, unspecified: Secondary | ICD-10-CM | POA: Diagnosis not present

## 2024-03-14 DIAGNOSIS — Z23 Encounter for immunization: Secondary | ICD-10-CM | POA: Diagnosis not present

## 2024-03-14 DIAGNOSIS — I10 Essential (primary) hypertension: Secondary | ICD-10-CM | POA: Diagnosis not present

## 2024-04-25 DIAGNOSIS — Z302 Encounter for sterilization: Secondary | ICD-10-CM | POA: Diagnosis not present
